# Patient Record
Sex: Male | Born: 1935 | Race: White | Hispanic: No | Marital: Married | Smoking: Never smoker
Health system: Southern US, Community
[De-identification: ages and names within clinical notes are randomized; demographics above are authoritative.]

## PROBLEM LIST (undated history)

## (undated) DIAGNOSIS — I2510 Atherosclerotic heart disease of native coronary artery without angina pectoris: Secondary | ICD-10-CM

## (undated) DIAGNOSIS — I639 Cerebral infarction, unspecified: Secondary | ICD-10-CM

## (undated) DIAGNOSIS — I6529 Occlusion and stenosis of unspecified carotid artery: Secondary | ICD-10-CM

## (undated) DIAGNOSIS — C61 Malignant neoplasm of prostate: Secondary | ICD-10-CM

## (undated) DIAGNOSIS — N400 Enlarged prostate without lower urinary tract symptoms: Secondary | ICD-10-CM

## (undated) DIAGNOSIS — E785 Hyperlipidemia, unspecified: Secondary | ICD-10-CM

## (undated) DIAGNOSIS — I10 Essential (primary) hypertension: Secondary | ICD-10-CM

## (undated) DIAGNOSIS — H3530 Unspecified macular degeneration: Secondary | ICD-10-CM

## (undated) HISTORY — DX: Cerebral infarction, unspecified: I63.9

## (undated) HISTORY — DX: Malignant neoplasm of prostate: C61

## (undated) HISTORY — DX: Atherosclerotic heart disease of native coronary artery without angina pectoris: I25.10

## (undated) HISTORY — DX: Hyperlipidemia, unspecified: E78.5

## (undated) HISTORY — PX: JOINT REPLACEMENT: SHX530

## (undated) HISTORY — DX: Essential (primary) hypertension: I10

---

## 1996-12-24 HISTORY — PX: CORONARY ARTERY BYPASS GRAFT: SHX141

## 1998-02-15 DIAGNOSIS — I639 Cerebral infarction, unspecified: Secondary | ICD-10-CM

## 1998-02-15 HISTORY — DX: Cerebral infarction, unspecified: I63.9

## 1999-05-07 ENCOUNTER — Inpatient Hospital Stay (HOSPITAL_COMMUNITY): Admission: EM | Admit: 1999-05-07 | Discharge: 1999-05-11 | Payer: Self-pay | Admitting: Emergency Medicine

## 1999-05-07 ENCOUNTER — Encounter: Payer: Self-pay | Admitting: Internal Medicine

## 1999-05-11 ENCOUNTER — Encounter: Payer: Self-pay | Admitting: Internal Medicine

## 2001-06-08 ENCOUNTER — Emergency Department (HOSPITAL_COMMUNITY): Admission: EM | Admit: 2001-06-08 | Discharge: 2001-06-08 | Payer: Self-pay | Admitting: *Deleted

## 2001-06-12 ENCOUNTER — Emergency Department (HOSPITAL_COMMUNITY): Admission: EM | Admit: 2001-06-12 | Discharge: 2001-06-12 | Payer: Self-pay | Admitting: Emergency Medicine

## 2006-02-15 HISTORY — PX: TRANSURETHRAL RESECTION OF PROSTATE: SHX73

## 2006-12-07 ENCOUNTER — Ambulatory Visit: Payer: Self-pay | Admitting: Cardiology

## 2006-12-09 ENCOUNTER — Ambulatory Visit: Payer: Self-pay

## 2006-12-21 ENCOUNTER — Encounter (INDEPENDENT_AMBULATORY_CARE_PROVIDER_SITE_OTHER): Payer: Self-pay | Admitting: Urology

## 2006-12-21 ENCOUNTER — Inpatient Hospital Stay (HOSPITAL_COMMUNITY): Admission: RE | Admit: 2006-12-21 | Discharge: 2006-12-26 | Payer: Self-pay | Admitting: Urology

## 2006-12-26 ENCOUNTER — Encounter (INDEPENDENT_AMBULATORY_CARE_PROVIDER_SITE_OTHER): Payer: Self-pay | Admitting: Internal Medicine

## 2007-01-31 ENCOUNTER — Ambulatory Visit: Payer: Self-pay | Admitting: Vascular Surgery

## 2007-02-01 ENCOUNTER — Encounter: Payer: Self-pay | Admitting: Vascular Surgery

## 2007-02-01 ENCOUNTER — Ambulatory Visit: Payer: Self-pay | Admitting: Vascular Surgery

## 2007-02-01 ENCOUNTER — Inpatient Hospital Stay (HOSPITAL_COMMUNITY): Admission: RE | Admit: 2007-02-01 | Discharge: 2007-02-02 | Payer: Self-pay | Admitting: Vascular Surgery

## 2007-02-01 HISTORY — PX: CAROTID ENDARTERECTOMY: SUR193

## 2007-02-21 ENCOUNTER — Ambulatory Visit: Payer: Self-pay | Admitting: Vascular Surgery

## 2007-04-18 ENCOUNTER — Ambulatory Visit (HOSPITAL_COMMUNITY): Admission: RE | Admit: 2007-04-18 | Discharge: 2007-04-18 | Payer: Self-pay | Admitting: Urology

## 2007-12-05 ENCOUNTER — Ambulatory Visit: Payer: Self-pay | Admitting: Vascular Surgery

## 2007-12-14 ENCOUNTER — Ambulatory Visit (HOSPITAL_COMMUNITY): Admission: RE | Admit: 2007-12-14 | Discharge: 2007-12-14 | Payer: Self-pay | Admitting: Urology

## 2008-11-13 ENCOUNTER — Telehealth (INDEPENDENT_AMBULATORY_CARE_PROVIDER_SITE_OTHER): Payer: Self-pay | Admitting: *Deleted

## 2008-11-13 ENCOUNTER — Ambulatory Visit: Payer: Self-pay | Admitting: Cardiovascular Disease

## 2008-11-14 ENCOUNTER — Encounter: Payer: Self-pay | Admitting: Cardiology

## 2008-11-14 ENCOUNTER — Ambulatory Visit: Payer: Self-pay

## 2008-11-20 ENCOUNTER — Ambulatory Visit: Payer: Self-pay | Admitting: Cardiovascular Disease

## 2008-12-03 ENCOUNTER — Ambulatory Visit: Payer: Self-pay | Admitting: Vascular Surgery

## 2008-12-07 ENCOUNTER — Ambulatory Visit: Payer: Self-pay | Admitting: Cardiology

## 2008-12-07 ENCOUNTER — Inpatient Hospital Stay (HOSPITAL_COMMUNITY): Admission: EM | Admit: 2008-12-07 | Discharge: 2008-12-14 | Payer: Self-pay | Admitting: Emergency Medicine

## 2008-12-09 ENCOUNTER — Encounter (INDEPENDENT_AMBULATORY_CARE_PROVIDER_SITE_OTHER): Payer: Self-pay | Admitting: Internal Medicine

## 2008-12-16 ENCOUNTER — Telehealth: Payer: Self-pay | Admitting: Cardiovascular Disease

## 2008-12-17 ENCOUNTER — Ambulatory Visit: Payer: Self-pay | Admitting: Cardiovascular Disease

## 2008-12-18 ENCOUNTER — Telehealth: Payer: Self-pay | Admitting: Cardiovascular Disease

## 2008-12-20 ENCOUNTER — Telehealth: Payer: Self-pay | Admitting: Cardiovascular Disease

## 2008-12-24 ENCOUNTER — Telehealth: Payer: Self-pay | Admitting: Cardiovascular Disease

## 2009-01-14 ENCOUNTER — Telehealth: Payer: Self-pay | Admitting: Cardiovascular Disease

## 2009-08-21 ENCOUNTER — Telehealth: Payer: Self-pay | Admitting: Cardiovascular Disease

## 2009-08-22 ENCOUNTER — Ambulatory Visit: Payer: Self-pay | Admitting: Cardiovascular Disease

## 2010-02-19 ENCOUNTER — Ambulatory Visit
Admission: RE | Admit: 2010-02-19 | Discharge: 2010-02-19 | Payer: Self-pay | Source: Home / Self Care | Attending: Vascular Surgery | Admitting: Vascular Surgery

## 2010-02-24 ENCOUNTER — Ambulatory Visit
Admission: RE | Admit: 2010-02-24 | Discharge: 2010-02-24 | Payer: Self-pay | Source: Home / Self Care | Attending: Vascular Surgery | Admitting: Vascular Surgery

## 2010-03-02 ENCOUNTER — Ambulatory Visit
Admission: RE | Admit: 2010-03-02 | Discharge: 2010-03-02 | Payer: Self-pay | Source: Home / Self Care | Attending: Cardiovascular Disease | Admitting: Cardiovascular Disease

## 2010-03-06 ENCOUNTER — Telehealth (INDEPENDENT_AMBULATORY_CARE_PROVIDER_SITE_OTHER): Payer: Self-pay | Admitting: *Deleted

## 2010-03-11 ENCOUNTER — Telehealth: Payer: Self-pay | Admitting: Cardiovascular Disease

## 2010-03-13 LAB — COMPREHENSIVE METABOLIC PANEL
ALT: 18 U/L (ref 0–53)
Alkaline Phosphatase: 114 U/L (ref 39–117)
BUN: 29 mg/dL — ABNORMAL HIGH (ref 6–23)
Chloride: 99 mEq/L (ref 96–112)
Glucose, Bld: 244 mg/dL — ABNORMAL HIGH (ref 70–99)

## 2010-03-13 LAB — PROTIME-INR
INR: 0.97 (ref 0.00–1.49)
Prothrombin Time: 13.1 seconds (ref 11.6–15.2)

## 2010-03-13 LAB — CBC
Hemoglobin: 16.5 g/dL (ref 13.0–17.0)
MCH: 29.2 pg (ref 26.0–34.0)
MCHC: 34 g/dL (ref 30.0–36.0)
MCV: 85.7 fL (ref 78.0–100.0)
Platelets: 170 10*3/uL (ref 150–400)
RBC: 5.66 MIL/uL (ref 4.22–5.81)
RDW: 13.6 % (ref 11.5–15.5)

## 2010-03-13 LAB — URINALYSIS, ROUTINE W REFLEX MICROSCOPIC
Bilirubin Urine: NEGATIVE
Hgb urine dipstick: NEGATIVE
Ketones, ur: NEGATIVE mg/dL
Specific Gravity, Urine: 1.019 (ref 1.005–1.030)
Urine Glucose, Fasting: 500 mg/dL — AB
pH: 6 (ref 5.0–8.0)

## 2010-03-13 LAB — URINE MICROSCOPIC-ADD ON

## 2010-03-13 LAB — SURGICAL PCR SCREEN: MRSA, PCR: NEGATIVE

## 2010-03-17 NOTE — Progress Notes (Signed)
  Phone Note Outgoing Call   Call placed by: Dessie Coma,  August 21, 2009 10:27 AM Call placed to: Patient Summary of Call: Patient notified via wife, needs f/u appt. with Dr. Kirke Corin since using NTG frequently.  Wife advised to call Dtr. at [phone] to schedule appt. since she will be bringing patient.  Appt. scheduled with Dtr. Noreene Larsson for tomorrow at 10:30am.

## 2010-03-18 ENCOUNTER — Other Ambulatory Visit: Payer: Self-pay | Admitting: Vascular Surgery

## 2010-03-18 ENCOUNTER — Inpatient Hospital Stay (HOSPITAL_COMMUNITY)
Admission: RE | Admit: 2010-03-18 | Discharge: 2010-03-19 | DRG: 039 | Disposition: A | Discharge status: Home / Self Care | Payer: MEDICARE | Attending: Vascular Surgery | Admitting: Vascular Surgery

## 2010-03-18 DIAGNOSIS — I6529 Occlusion and stenosis of unspecified carotid artery: Secondary | ICD-10-CM

## 2010-03-18 DIAGNOSIS — I10 Essential (primary) hypertension: Secondary | ICD-10-CM | POA: Diagnosis present

## 2010-03-18 DIAGNOSIS — E785 Hyperlipidemia, unspecified: Secondary | ICD-10-CM | POA: Diagnosis present

## 2010-03-18 DIAGNOSIS — E119 Type 2 diabetes mellitus without complications: Secondary | ICD-10-CM | POA: Diagnosis present

## 2010-03-18 DIAGNOSIS — Z7982 Long term (current) use of aspirin: Secondary | ICD-10-CM

## 2010-03-18 DIAGNOSIS — Z8673 Personal history of transient ischemic attack (TIA), and cerebral infarction without residual deficits: Secondary | ICD-10-CM

## 2010-03-18 DIAGNOSIS — Z7902 Long term (current) use of antithrombotics/antiplatelets: Secondary | ICD-10-CM

## 2010-03-18 DIAGNOSIS — Z8546 Personal history of malignant neoplasm of prostate: Secondary | ICD-10-CM

## 2010-03-18 DIAGNOSIS — Z96649 Presence of unspecified artificial hip joint: Secondary | ICD-10-CM

## 2010-03-18 DIAGNOSIS — I2510 Atherosclerotic heart disease of native coronary artery without angina pectoris: Secondary | ICD-10-CM | POA: Diagnosis present

## 2010-03-18 HISTORY — PX: CAROTID ENDARTERECTOMY: SUR193

## 2010-03-18 LAB — GLUCOSE, CAPILLARY
Glucose-Capillary: 265 mg/dL — ABNORMAL HIGH (ref 70–99)
Glucose-Capillary: 291 mg/dL — ABNORMAL HIGH (ref 70–99)

## 2010-03-19 LAB — BASIC METABOLIC PANEL
BUN: 19 mg/dL (ref 6–23)
Calcium: 8.7 mg/dL (ref 8.4–10.5)
Creatinine, Ser: 1.31 mg/dL (ref 0.4–1.5)
Potassium: 3.9 mEq/L (ref 3.5–5.1)

## 2010-03-19 LAB — CBC
Hemoglobin: 13.8 g/dL (ref 13.0–17.0)
MCV: 85.6 fL (ref 78.0–100.0)
Platelets: 152 10*3/uL (ref 150–400)

## 2010-03-19 LAB — GLUCOSE, CAPILLARY
Glucose-Capillary: 225 mg/dL — ABNORMAL HIGH (ref 70–99)
Glucose-Capillary: 319 mg/dL — ABNORMAL HIGH (ref 70–99)

## 2010-03-19 NOTE — Progress Notes (Signed)
Summary: TEST RESULTS  Phone Note Call from Patient Call back at Home Phone [phone]   Caller: Spouse Summary of Call: PT WOULD LIKE RESULTS OF TEST DONE MONDAY AT [facility].  HE IS VERY ANXIOUS TO HEAR. Initial call taken by: Park Breed,  March 11, 2010 8:51 AM  Follow-up for Phone Call        [facility] for the results and they are to fax them to me. Follow-up by: Dessie Coma  LPN,  March 12, 2010 10:52 AM  Additional Follow-up for Phone Call Additional follow up Details #1::        Dr. Kirke Corin spoke with patient's wife re: test. Additional Follow-up by: Dessie Coma  LPN,  March 13, 2010 10:39 AM

## 2010-03-19 NOTE — Progress Notes (Signed)
----   Converted from flag ---- ---- 03/06/2010 10:44 AM, Marilynne Halsted, CMA, AAMA wrote: APPROVED NUC TEST  ------------------------------

## 2010-03-20 LAB — CROSSMATCH: Unit division: 0

## 2010-03-20 NOTE — Op Note (Signed)
NAMEMELBOURNE, JAKUBIAK             ACCOUNT NO.:  [id number]  MEDICAL RECORD NO.:  [id number]           PATIENT TYPE:  I  LOCATION:  3310                         FACILITY:  [facility]  PHYSICIAN:  Quita Skye. Hart Rochester, M.D.  DATE OF BIRTH:  June 04, 1928  DATE OF PROCEDURE:  03/18/2010 DATE OF DISCHARGE:                              OPERATIVE REPORT   PREOPERATIVE DIAGNOSIS:  Severe asymptomatic right internal carotid stenosis.  POSTOPERATIVE DIAGNOSIS:  Severe asymptomatic right internal carotid stenosis.  OPERATION:  Right carotid endarterectomy with Dacron patch angioplasty.  SURGEON:  Quita Skye. Hart Rochester, MD  FIRST ASSISTANT:  Della Goo, PA-C  ANESTHESIA:  General endotracheal.  BRIEF HISTORY:  This patient has previously had a left carotid surgery and had a moderate right internal carotid stenosis, which was being followed.  This progressed to a 90% stenosis and the patient remained asymptomatic, scheduled for right carotid endarterectomy on an elective basis.  PROCEDURE:  The patient was taken to the operating room, placed in a supine position at which time satisfactory general endotracheal anesthesia was administered.  The right neck was prepped with Betadine scrub and solution and draped in routine sterile manner.  Incision was made along the anterior border of the sternocleidomastoid muscle and carried down through subcutaneous tissue and platysma using Bovie. Common facial vein and external jugular veins were ligated with 3-0 silk ties and divided exposing the common internal and external carotid artery.  Care was taken not to injure the vagus or hypoglossal nerves, both which were exposed.  There was a heavily calcified plaque at the carotid bifurcation extending posteriorly up the internal carotid artery about 4 cm.  Distal vessel appeared normal.  A #10 shunt was prepared and the patient was heparinized.  Carotid vessels were occluded with vascular clamps.  Longitudinal  opening made in the common carotid with 15 blade, extended up the internal carotid with Potts scissors to a point distal to the disease.  The plaque was at least 90% stenotic in severity.  Distal vessel appeared normal.  A #10 shunt was inserted without difficulty reestablishing flow in about 2 minutes.  Standard endarterectomy was then performed using the elevator and Potts scissors with eversion endarterectomy of the external carotid.  Plaque feathered off the distal internal carotid artery nicely not requiring any tacking sutures.  Wound was thoroughly irrigated with heparin saline.  All loose debris carefully removed.  Arteriotomy was closed with a patch using continuous 6-0 Prolene.  Prior to completion of closure, shunt was removed after about 30 minutes of shunt time.  Following antegrade and retrograde flushing, closure was completed reestablishing flow initially up the external and internal branch.  There was excellent flow in the distal internal carotid artery with a Doppler, but the external carotid flow was somewhat muffled.  Therefore, external was occluded proximally and distally and a transverse arteriotomy made about 3 cm distal to the origin, and there was some intimal disruption where the eversion endarterectomy had been performed.  This was removed under direct vision with excellent back bleeding and antegrade bleeding and the short arteriotomy re-closed with 6-0 Prolene.  Clamp was then released and  there was excellent Doppler flow in both vessels.  Protamine given to reverse the heparin.  Following adequate hemostasis, wound was irrigated with saline, closed in layers with Vicryl in a subcuticular fashion. Sterile dressing applied.  The patient was taken to the recovery room in a satisfactory condition.     Quita Skye Hart Rochester, M.D.     JDL/MEDQ  D:  03/18/2010  T:  03/19/2010  Job:  [id number]  Electronically Signed by Josephina Gip M.D. on 03/20/2010 12:49:05 PM

## 2010-03-20 NOTE — Discharge Summary (Addendum)
  NAMEPABLO, [name]             ACCOUNT NO.:  [id number]  MEDICAL RECORD NO.:  [id number]           PATIENT TYPE:  I  LOCATION:  3310                         FACILITY:  [facility]  PHYSICIAN:  Quita Skye. [name], M.D.  DATE OF BIRTH:  05-Nov-1928  DATE OF ADMISSION:  03/18/2010 DATE OF DISCHARGE:  03/19/2010                              DISCHARGE SUMMARY   CHIEF COMPLAINT:  Right carotid occlusive disease.  HISTORY OF PRESENT ILLNESS:  [name] is a 77 year old gentleman who had previously undergone a left carotid endarterectomy in December 2008, by Dr. 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  He had multiple neurological problems in the past and he has been followed for moderate right internal carotid artery stenosis which over the past 14 months has progressed from 50-95%.  He has no active right brain symptoms.  He is now being evaluated for severe right internal carotid artery stenosis. Duplex scan revealed 95% right carotid stenosis and no flow reduction in the left internal carotid artery.  The patient was admitted for right carotid endarterectomy.  PAST MEDICAL HISTORY: 1. Diabetes. 2. Hypertension. 3. Hyperlipidemia. 4. Coronary artery disease. 5. Prostate cancer. 6. History of hip replacement.  HOSPITAL COURSE:  The patient was taken to the operating room on March 18, 2010, for a right carotid endarterectomy with Dacron patch angioplasty.  Postoperatively, the patient did well.  He was alert and oriented x3.  He had good and equal strength in bilateral upper and lower extremities.  He had some dizziness initially on the first postoperative day with ambulation but then was able to ambulate without difficulty.  He was voiding and taking p.o.  He had good and equal strength in bilateral upper and lower extremities.  He had no tongue deviation.  No facial droop.  No difficulty swallowing and no headache. He will be discharged home.  DISCHARGE MEDICATIONS: 1.  Percocet 1-2 tablets every 4 hours as needed for pain. 2. Amlodipine 10 mg at bedtime. 3. Alka-Seltzer over-the-counter antacid 2 tablets daily at bedtime as     needed. 4. Enteric-coated aspirin 81 mg daily. 5. Plavix 75 mg daily. 6. Furosemide 20 mg daily. 7. Glipizide 10 mg twice daily. 8. Isosorbide 60 mg twice daily. 9. Lisinopril 20 mg twice daily. 10.Lorazepam 1 tablet at bedtime as needed for anxiety. 11.Sublingual nitroglycerin as needed for chest pain.  FINAL DIAGNOSES: 1. Critical asymptomatic right carotid stenosis, greater than 95%,     status post right carotid endarterectomy. 2. Hypertension. 3. Diabetes. 4. Hyperlipidemia, well controlled with his preoperative medications     while in-house.  DISPOSITION:  The patient will be discharged home and follow up with Dr. Hart Haynes in 2 weeks.     Della Goo, PA-C   ______________________________ Quita Skye [name], M.D.    RR/MEDQ  D:  03/19/2010  T:  03/20/2010  Job:  [id number]  Electronically Signed by Josephina Gip M.D. on 03/20/2010 12:49:09 PM Electronically Signed by Della Goo PA on 03/23/2010 10:18:25 AM

## 2010-03-31 ENCOUNTER — Ambulatory Visit (INDEPENDENT_AMBULATORY_CARE_PROVIDER_SITE_OTHER): Payer: MEDICARE | Admitting: Vascular Surgery

## 2010-03-31 DIAGNOSIS — I6529 Occlusion and stenosis of unspecified carotid artery: Secondary | ICD-10-CM

## 2010-04-01 NOTE — Assessment & Plan Note (Signed)
OFFICE VISIT  [name], [name] DOB:  1928-05-29                                       03/31/2010 EAVWU#:[id number]  The patient returns for followup 2 weeks post-right carotid endarterectomy for severe but asymptomatic right internal carotid stenosis.  He has done very well since his surgery with no hoarseness, difficulty swallowing, or hemispheric or non-hemispheric TIAs.  He is taking aspirin and Plavix on a daily basis.  On physical exam, blood pressure is 140/83, heart rate 86, respirations 14, temperature of 98.  General:  He is a well-developed, well-nourished male who is in no apparent distress, alert and oriented x3.  The neck is supple, 3+ carotid pulses.  No bruits are audible.  Right neck incision has healed nicely.  Neurologic:  Normal.  We are very pleased with his early result and encouraged him to continue to increase his activity as tolerated.  He will return in 6 months for followup carotid duplex exam at that time.  If he develops any neurologic symptoms in the interim, he will be in touch with [place] for further evaluation.    Quita Skye Hart Rochester, M.D. Electronically Signed  JDL/MEDQ  D:  03/31/2010  T:  04/01/2010  Job:  [id number]

## 2010-05-19 ENCOUNTER — Telehealth: Payer: Self-pay | Admitting: Cardiovascular Disease

## 2010-05-19 DIAGNOSIS — R609 Edema, unspecified: Secondary | ICD-10-CM

## 2010-05-19 MED ORDER — FUROSEMIDE 20 MG PO TABS: 20.0000 mg | ORAL_TABLET | Freq: Every day | ORAL | Status: DC

## 2010-05-19 NOTE — Telephone Encounter (Signed)
Refilled prescription for Lasix 20mg  at [facility] mail order [facility].

## 2010-05-21 LAB — BASIC METABOLIC PANEL
BUN: 21 mg/dL (ref 6–23)
BUN: 29 mg/dL — ABNORMAL HIGH (ref 6–23)
CO2: 24 mEq/L (ref 19–32)
CO2: 24 mEq/L (ref 19–32)
CO2: 26 mEq/L (ref 19–32)
CO2: 27 mEq/L (ref 19–32)
CO2: 27 mEq/L (ref 19–32)
CO2: 30 mEq/L (ref 19–32)
Calcium: 8.4 mg/dL (ref 8.4–10.5)
Calcium: 8.5 mg/dL (ref 8.4–10.5)
Calcium: 8.6 mg/dL (ref 8.4–10.5)
Calcium: 8.7 mg/dL (ref 8.4–10.5)
Calcium: 8.7 mg/dL (ref 8.4–10.5)
Calcium: 8.8 mg/dL (ref 8.4–10.5)
Calcium: 9 mg/dL (ref 8.4–10.5)
Chloride: 103 mEq/L (ref 96–112)
Chloride: 104 mEq/L (ref 96–112)
Chloride: 106 mEq/L (ref 96–112)
Chloride: 98 mEq/L (ref 96–112)
Creatinine, Ser: 1.08 mg/dL (ref 0.4–1.5)
Creatinine, Ser: 1.28 mg/dL (ref 0.4–1.5)
Creatinine, Ser: 1.3 mg/dL (ref 0.4–1.5)
Creatinine, Ser: 1.36 mg/dL (ref 0.4–1.5)
Creatinine, Ser: 1.43 mg/dL (ref 0.4–1.5)
GFR calc Af Amer: 58 mL/min — ABNORMAL LOW (ref >60)
GFR calc Af Amer: >60 mL/min (ref >60)
GFR calc Af Amer: >60 mL/min (ref >60)
GFR calc Af Amer: >60 mL/min (ref >60)
GFR calc Af Amer: >60 mL/min (ref >60)
GFR calc non Af Amer: 48 mL/min — ABNORMAL LOW (ref >60)
GFR calc non Af Amer: 54 mL/min — ABNORMAL LOW (ref >60)
Glucose, Bld: 119 mg/dL — ABNORMAL HIGH (ref 70–99)
Glucose, Bld: 159 mg/dL — ABNORMAL HIGH (ref 70–99)
Glucose, Bld: 196 mg/dL — ABNORMAL HIGH (ref 70–99)
Potassium: 4.5 mEq/L (ref 3.5–5.1)
Sodium: 136 mEq/L (ref 135–145)
Sodium: 137 mEq/L (ref 135–145)
Sodium: 140 mEq/L (ref 135–145)
Sodium: 142 mEq/L (ref 135–145)

## 2010-05-21 LAB — URINALYSIS, MICROSCOPIC ONLY
Leukocytes, UA: NEGATIVE
Nitrite: NEGATIVE
Specific Gravity, Urine: 1.02 (ref 1.005–1.030)
pH: 5 (ref 5.0–8.0)

## 2010-05-21 LAB — CARDIAC PANEL(CRET KIN+CKTOT+MB+TROPI)
CK, MB: 11.8 ng/mL — ABNORMAL HIGH (ref 0.3–4.0)
CK, MB: 18.9 ng/mL — ABNORMAL HIGH (ref 0.3–4.0)
Relative Index: INVALID (ref 0.0–2.5)
Troponin I: 0.48 ng/mL — ABNORMAL HIGH (ref 0.00–0.06)

## 2010-05-21 LAB — GLUCOSE, CAPILLARY
Glucose-Capillary: 152 mg/dL — ABNORMAL HIGH (ref 70–99)
Glucose-Capillary: 172 mg/dL — ABNORMAL HIGH (ref 70–99)
Glucose-Capillary: 172 mg/dL — ABNORMAL HIGH (ref 70–99)
Glucose-Capillary: 187 mg/dL — ABNORMAL HIGH (ref 70–99)
Glucose-Capillary: 205 mg/dL — ABNORMAL HIGH (ref 70–99)
Glucose-Capillary: 207 mg/dL — ABNORMAL HIGH (ref 70–99)
Glucose-Capillary: 216 mg/dL — ABNORMAL HIGH (ref 70–99)
Glucose-Capillary: 217 mg/dL — ABNORMAL HIGH (ref 70–99)
Glucose-Capillary: 229 mg/dL — ABNORMAL HIGH (ref 70–99)
Glucose-Capillary: 234 mg/dL — ABNORMAL HIGH (ref 70–99)
Glucose-Capillary: 236 mg/dL — ABNORMAL HIGH (ref 70–99)

## 2010-05-21 LAB — LIPID PANEL
Cholesterol: 263 mg/dL — ABNORMAL HIGH (ref 0–200)
Total CHOL/HDL Ratio: 6.4 RATIO
VLDL: 22 mg/dL (ref 0–40)

## 2010-05-21 LAB — MAGNESIUM
Magnesium: 1.8 mg/dL (ref 1.5–2.5)
Magnesium: 1.9 mg/dL (ref 1.5–2.5)
Magnesium: 1.9 mg/dL (ref 1.5–2.5)
Magnesium: 2 mg/dL (ref 1.5–2.5)
Magnesium: 2.1 mg/dL (ref 1.5–2.5)
Magnesium: 2.1 mg/dL (ref 1.5–2.5)

## 2010-05-21 LAB — HEPARIN LEVEL (UNFRACTIONATED)
Heparin Unfractionated: 0.45 IU/mL (ref 0.30–0.70)
Heparin Unfractionated: 0.55 IU/mL (ref 0.30–0.70)
Heparin Unfractionated: 0.7 IU/mL (ref 0.30–0.70)

## 2010-05-21 LAB — CBC
HCT: 34.8 % — ABNORMAL LOW (ref 39.0–52.0)
HCT: 40 % (ref 39.0–52.0)
HCT: 44.5 % (ref 39.0–52.0)
Hemoglobin: 11.5 g/dL — ABNORMAL LOW (ref 13.0–17.0)
Hemoglobin: 12.3 g/dL — ABNORMAL LOW (ref 13.0–17.0)
Hemoglobin: 13.2 g/dL (ref 13.0–17.0)
Hemoglobin: 13.4 g/dL (ref 13.0–17.0)
Hemoglobin: 13.9 g/dL (ref 13.0–17.0)
Hemoglobin: 15.5 g/dL (ref 13.0–17.0)
MCHC: 34.6 g/dL (ref 30.0–36.0)
MCHC: 34.7 g/dL (ref 30.0–36.0)
MCHC: 35.1 g/dL (ref 30.0–36.0)
MCHC: 35.2 g/dL (ref 30.0–36.0)
MCHC: 35.2 g/dL (ref 30.0–36.0)
MCHC: 35.3 g/dL (ref 30.0–36.0)
MCHC: 35.9 g/dL (ref 30.0–36.0)
MCV: 87.9 fL (ref 78.0–100.0)
MCV: 87.9 fL (ref 78.0–100.0)
MCV: 88 fL (ref 78.0–100.0)
MCV: 88.4 fL (ref 78.0–100.0)
Platelets: 85 10*3/uL — ABNORMAL LOW (ref 150–400)
RBC: 3.5 MIL/uL — ABNORMAL LOW (ref 4.22–5.81)
RBC: 3.52 MIL/uL — ABNORMAL LOW (ref 4.22–5.81)
RBC: 3.71 MIL/uL — ABNORMAL LOW (ref 4.22–5.81)
RBC: 3.95 MIL/uL — ABNORMAL LOW (ref 4.22–5.81)
RBC: 4.31 MIL/uL (ref 4.22–5.81)
RBC: 4.4 MIL/uL (ref 4.22–5.81)
RBC: 4.55 MIL/uL (ref 4.22–5.81)
RBC: 5.04 MIL/uL (ref 4.22–5.81)
RDW: 13.8 % (ref 11.5–15.5)
RDW: 13.9 % (ref 11.5–15.5)
RDW: 13.9 % (ref 11.5–15.5)
WBC: 4.4 10*3/uL (ref 4.0–10.5)
WBC: 4.5 10*3/uL (ref 4.0–10.5)
WBC: 4.6 10*3/uL (ref 4.0–10.5)
WBC: 5.3 10*3/uL (ref 4.0–10.5)
WBC: 5.7 10*3/uL (ref 4.0–10.5)
WBC: 6.4 10*3/uL (ref 4.0–10.5)
WBC: 7 10*3/uL (ref 4.0–10.5)

## 2010-05-21 LAB — DIFFERENTIAL
Eosinophils Relative: 1 % (ref 0–5)
Monocytes Absolute: 0.4 10*3/uL (ref 0.1–1.0)
Neutro Abs: 5 10*3/uL (ref 1.7–7.7)
Neutrophils Relative %: 79 % — ABNORMAL HIGH (ref 43–77)

## 2010-05-21 LAB — PHOSPHORUS
Phosphorus: 3 mg/dL (ref 2.3–4.6)
Phosphorus: 3.1 mg/dL (ref 2.3–4.6)
Phosphorus: 3.2 mg/dL (ref 2.3–4.6)
Phosphorus: 3.3 mg/dL (ref 2.3–4.6)
Phosphorus: 3.5 mg/dL (ref 2.3–4.6)

## 2010-05-21 LAB — PROTIME-INR
INR: 1.09 (ref 0.00–1.49)
Prothrombin Time: 14 seconds (ref 11.6–15.2)

## 2010-05-21 LAB — HEPATIC FUNCTION PANEL
ALT: 16 U/L (ref 0–53)
Albumin: 3.3 g/dL — ABNORMAL LOW (ref 3.5–5.2)
Alkaline Phosphatase: 59 U/L (ref 39–117)
Total Bilirubin: 0.6 mg/dL (ref 0.3–1.2)

## 2010-05-21 LAB — URINALYSIS, ROUTINE W REFLEX MICROSCOPIC
Ketones, ur: 15 mg/dL — AB
Leukocytes, UA: NEGATIVE
Nitrite: NEGATIVE
Protein, ur: 30 mg/dL — AB

## 2010-05-21 LAB — COMPREHENSIVE METABOLIC PANEL
ALT: 21 U/L (ref 0–53)
Albumin: 4.3 g/dL (ref 3.5–5.2)
Alkaline Phosphatase: 88 U/L (ref 39–117)
Calcium: 9.9 mg/dL (ref 8.4–10.5)
Chloride: 101 mEq/L (ref 96–112)
Creatinine, Ser: 1.27 mg/dL (ref 0.4–1.5)
GFR calc Af Amer: >60 mL/min (ref >60)
Potassium: 3.8 mEq/L (ref 3.5–5.1)
Sodium: 139 mEq/L (ref 135–145)

## 2010-05-21 LAB — HEMOGLOBIN A1C: Hgb A1c MFr Bld: 8.6 % — ABNORMAL HIGH (ref 4.6–6.1)

## 2010-05-21 LAB — POCT CARDIAC MARKERS
Myoglobin, poc: 174 ng/mL (ref 12–200)
Troponin i, poc: <0.05 ng/mL (ref 0.00–0.09)

## 2010-05-21 LAB — URINE CULTURE

## 2010-06-18 ENCOUNTER — Ambulatory Visit: Payer: MEDICARE | Admitting: Cardiovascular Disease

## 2010-06-30 NOTE — Procedures (Signed)
CAROTID DUPLEX EXAM   INDICATION:  Follow up known carotid artery disease.   HISTORY:  Diabetes:  Yes.  Cardiac:  Yes.  Hypertension:  Yes.  Smoking:  No.  Previous Surgery:  Left carotid endarterectomy.  CV History:  Amaurosis Fugax No, Paresthesias No, Hemiparesis No.                                       RIGHT             LEFT  Brachial systolic pressure:         178               160  Brachial Doppler waveforms:         Biphasic          Biphasic  Vertebral direction of flow:        Not well visualized                 Antegrade  DUPLEX VELOCITIES (cm/sec)  CCA peak systolic                   86                146  ECA peak systolic                   310               147  ICA peak systolic                   142               35  ICA end diastolic                   43                33  PLAQUE MORPHOLOGY:                  Heterogenous      None  PLAQUE AMOUNT:                      Moderate          None  PLAQUE LOCATION:                    ICA, ECA          None   IMPRESSION:  1. 40-59% stenosis noted in the right internal carotid artery.  2. Normal carotid duplex noted in the left internal carotid artery,      status post left carotid endarterectomy.  3. Antegrade left vertebral artery.   ___________________________________________  Quita Skye Hart Rochester, M.D.   MG/MEDQ  D:  12/05/2007  T:  12/05/2007  Job:  [id number]

## 2010-06-30 NOTE — Assessment & Plan Note (Signed)
OFFICE VISIT   [name], [name]  DOB:  February 14, 1929                                       02/24/2010  ZOXWR#:[id number]   REASON FOR ADMISSION:  Severe right internal carotid stenosis, 75%,  asymptomatic.   HISTORY OF PRESENT ILLNESS:  This 77 year old male patient has  previously undergone a left carotid endarterectomy by Dr. Hart Rochester in  December 2008 for a severe left internal carotid stenosis, having  previously had a left brain CVA.  He has had multiple neurologic  problems in the past.  He has been followed for a moderate right  internal carotid stenosis, and over the past 14 months this has  progressed from 50% to 95+% on the right side.  He has no active right  brain symptoms.  He is now being evaluated for severe right internal  carotid stenosis.  Today I ordered a carotid duplex exam which I  reviewed and interpreted.  He has a 95+% right internal carotid  stenosis.  No flow reduction in the left internal carotid where he had  previous surgery.   CHRONIC MEDICAL PROBLEMS:  1. Diabetes mellitus.  2. Hypertension.  3. Hyperlipidemia.  4. Coronary artery disease.  He had a myocardial infarction in August      2010.  He had coronary artery bypass grafting in 1998 by Dr.      Tyrone Sage.  5. Prostate cancer.  6. History of hip replacement.   SOCIAL HISTORY:  The patient is married and has 3 children.  He is  retired and does not use tobacco or alcohol.   FAMILY HISTORY:  Father died of coronary artery disease, age 58.  He has  sisters and brothers with diabetes.  No stroke in the family.   REVIEW OF SYSTEMS:  Positive for decreased vision, aphasia, chest  discomfort on occasion, dyspnea on exertion, depression, anxiety,  diarrhea, constipation.  All other systems are negative in the complete  review of systems.   PHYSICAL EXAMINATION:  Blood pressure 144/72, heart rate 80,  respirations 16.  General:  He is an elderly male who is in no  apparent  distress, alert and oriented x3.  HEENT:  Normal for age.  EOMs intact.  Lungs:  Clear to auscultation.  No rhonchi or wheezing.  Cardiovascular:  Regular rate and rhythm.  No murmurs.  Carotid pulses are 3+.  There is  short, high-pitched bruit on the right.  No bruit on the left.  Abdomen:  Soft, nontender with no masses.  Neurologic:  No focal weakness.  Musculoskeletal exam is free of major deformities.  Skin:  Free of  rashes.  Lower extremity exam reveals bilateral 3+ femoral pulses.   IMPRESSION:  1. Severe right internal carotid stenosis, asymptomatic.  2. Coronary artery disease, previous cardiac catheterization in August      2010, previous myocardial infarction at that time.  Coronary artery      bypass grafting in 1998.   PLAN:  Obtain a Cardiolite and a preoperative clearance from cardiology  by [facility] in [place].  Will tentatively schedule right carotid surgery  for Wednesday, January 25, at [facility].  Risks and benefits have  been thoroughly discussed with the patient, his wife, and daughter, and  they all agree to proceed.     Quita Skye Hart Rochester, M.D.  Electronically Signed   JDL/MEDQ  D:  02/24/2010  T:  02/24/2010  Job:  [id number]

## 2010-06-30 NOTE — Assessment & Plan Note (Signed)
[facility]                        [place] [facility] OFFICE NOTE   [name], VINJE                    MRN:          [id number]  DATE:03/02/2010                            DOB:          1929-02-04    [name] is a 77 year old gentleman who is here today for a  preoperative cardiovascular evaluation regarding a planned severe right  carotid endarterectomy to be done by Dr. Josephina Gip with [facility] [place].  The patient has the following extensive  cardiac history:  1. Coronary artery disease status post coronary artery bypass graft      surgery in 1998.  He had a non-ST-elevation myocardial infarction      in October 2010.  Cardiac catheterization at that time showed an      occluded vein graft to the RCA as well as the diagonal.  The LIMA      to LAD was patent as well as an SVG to small diagonal.  There was      another SVG to second marginal which was subtotally occluded with a      large burden of thrombus.  He was treated medically at that time.      Ejection fraction was 45%.  2. Ischemic cardiomyopathy with mildly reduced LV systolic function.      Ejection fraction was 45%.  3. Peripheral vascular disease status post left carotid      endarterectomy.  4. Hypertension.  5. Hyperlipidemia.   INTERVAL HISTORY:  The patient was recently found to have severe right  carotid stenosis by routine carotid Doppler which showed more than 75%  stenosis on the right side.  The plan is for carotid endarterectomy  after evaluating his cardiac status.  The patient has extensive cardiac  history as outlined above.   He continues to have occasional chest pain and on average he uses  nitroglycerin sublingual every few days, usually 25 tablet is enough for  a month.  According to him, his chest pain is as not as much as it was  in 2010.  He has chronic exertional dyspnea.  He does not do any regular  exercise.  He  stopped taking metoprolol that was prescribed to him  during his last visit.   MEDICATIONS:  1. Glipizide 10 mg twice daily.  2. Aspirin 81 mg once daily.  3. Plavix 75 mg once daily.  4. Isosorbide mononitrate 60 mg twice daily.  5. Lisinopril 20 mg twice daily.  6. Amlodipine 10 mg once daily.  7. Lasix 20 mg once daily.  8. Nitroglycerin 0.4 mg sublingual as needed.   PHYSICAL EXAMINATION:  GENERAL:  The patient appears to be at his stated  age and in no acute distress.  VITAL SIGNS:  Weight is 204.2 pounds, blood pressure is 159/83, pulse is  81, oxygen saturation is 97% on room air.  HEENT:  Normocephalic, atraumatic.  NECK:  There is a scar on the left side of the neck from his previous  carotid endarterectomy.  On the right side, there is a loud bruit heard.  RESPIRATORY:  Normal respiratory  effort with no use of accessory  muscles.  Auscultation reveals decreased breath sounds at the base  bilaterally.  CARDIOVASCULAR:  Normal PMI.  Normal S1 and S2 with no gallops or  murmurs.  ABDOMEN:  Benign, nontender, nondistended.  EXTREMITIES:  No clubbing, cyanosis, or edema.  SKIN:  Warm and dry with no rash.  PSYCHIATRIC:  He is alert, oriented x3 with normal mood and affect.   An electrocardiogram was performed which showed sinus rhythm with first-  degree AV block with left axis deviation.  There is ST and T-wave  abnormality in the anterior leads as well as the lateral leads which are  not different from his previous EKG.   IMPRESSION:  1. Preoperative cardiovascular evaluation for a planned right carotid      endarterectomy.  The patient has extensive cardiac history.  His      most recent cardiac event was in 2010 when he had myocardial      infarction.  At that time, cardiac catheterization showed that 3/5      grafts were actually nonfunctional.  His current functioning grafts      include LIMA to LAD and SVG to diagonal.  He continues to have      chronic  exertional angina, which is relieved by rest and sometimes      requires nitroglycerin.  He did not tolerate Ranexa in the past due      to GI symptoms.  I agree that a stress test is needed to evaluate      his cardiac risk.  Thus, we will request a Lexiscan nuclear stress      test for further evaluation.  If there are major areas of ischemia      with high-risk features, his risk might be high to the point were a      surgery is not possible.  However, if the stress test only shows      mild ischemia, then he will be able to proceed with the surgery at      moderate risk.  It he is deemed to be a high risk patient, then      consideration should be to treat him medically verses evaluating      him for carotid stenting.  In the meanwhile, I will go ahead and      start him on metoprolol 25 mg twice a day as he was on this      medication before.  I think this will give him some cardiac      protection during the surgery.  This was discussed extensively with      the patient and his family.  The patient is actually leaning      towards having the surgery done even if it is at the higher risk      because he is very worried about having a stroke in the future.  2. Hypertension:  Blood pressure is mildly elevated today.  We will      resume metoprolol.  3. Hyperlipidemia:  He is intolerant to statins due to myalgia.  The      patient will be notified with the results of the stress test.      Otherwise, he will return to see me in 4 months from now.     Lorine Bears, MD  Electronically Signed    MA/MedQ  DD: 03/02/2010  DT: 03/03/2010  Job #: [id number]

## 2010-06-30 NOTE — Op Note (Signed)
[name], [name]             ACCOUNT NO.:  [id number]   MEDICAL RECORD NO.:  [id number]          PATIENT TYPE:  INP   LOCATION:  NA                           FACILITY:  [facility]   PHYSICIAN:  Maretta Bees. Vonita Moss, M.D.DATE OF BIRTH:  [birth year]   DATE OF PROCEDURE:  12/21/2006  DATE OF DISCHARGE:                               OPERATIVE REPORT   PREOPERATIVE DIAGNOSIS:  Benign prostatic hypertrophy and urinary  retention.   POSTOPERATIVE DIAGNOSIS:  Benign prostatic hypertrophy and urinary  retention.   PROCEDURE:  Transurethral resection of the prostate.   SURGEON:  Maretta Bees. Vonita Moss, M.D.   ANESTHESIA:  General.   INDICATIONS:  This gentleman had had a long history of bladder outlet  obstructive symptoms and is intolerant of medical therapy and went into  retention, has failed voiding trials, and was finally convinced that he  needs something done interventionally.  He has a large 155 gram prostate  and I told him I would probably just do one lobe and would give him a  good chance to void and avoid an open surgical procedure.  He was  cleared preoperatively cardiac wise by Dr. Juanda Chance.   PROCEDURE IN DETAIL:  The patient was brought to the operating room and  placed in the lithotomy position.  The external genitalia were prepped  and draped in the usual fashion after removing his Foley but getting a  urine specimen for culture.  He received intravenous Cipro.  He was  sounded to [address] [place] without difficulty.  The 28 French resectoscope  sheath was inserted and the bladder was trabeculated but there were no  stones or tumors.  He had a moderate size median lobe and large lateral  lobes.  I resected the bladder neck and median lobe first and then  resected the right lateral lobe down to capsule.  I resected some  anterior tissue.  This took just a little over an hour and I felt that  proceeding to the second lobe would not be advisable at this point and I  felt like he was well  resected on the right side and with the median  lobe resected, he has a good chance of voiding. Residual chips were  removed from the bladder.  The ureteral orifices were seen to be intact.  There was good hemostasis.  The scope was removed  and a 24 French 30 mL Foley inserted with 40 mL placed in the balloon  and traction was placed and there was clear irrigation.  The catheter  was connected to closed drainage.  Estimated blood loss was  approximately 200 mL.  He was taken to the recovery room in good  condition having tolerated the procedure well.      Maretta Bees. Vonita Moss, M.D.  Electronically Signed     LJP/MEDQ  D:  12/21/2006  T:  12/21/2006  Job:  [id number]   cc:   Everardo Beals. Juanda Chance, MD, [facility]  [address]  [place], [place] [address]

## 2010-06-30 NOTE — Assessment & Plan Note (Signed)
[facility]                        [name] OFFICE NOTE   [name], [name]                    MRN:          [id number]  DATE:11/13/2008                            DOB:          1928-07-12    The patient is a 77 year old white male with past medical history  significant for coronary artery disease status post bypass surgery in  1998 with a LIMA to the LAD, SVG to the diagonal, sequential SVG to OM1,  OM2 and distal circumflex and an SVG to PDA who is presenting today with  worsening chest discomfort.  The patient's last left heart  catheterization was in March 2001 and at that time, his left ventricular  function was preserved, he had a patent LIMA to LAD with severe distal  disease, patent diagonal graft it supplies a small diagonal with poor  filling.  There is a patent vein graft to the distal circumflex and  severe disease to the most distal portion of the circumflex system from  the native vessel.  The right coronary artery had a vein graft to the  distal portion that filled slowly.  There was about a 70% proximal  narrowing into an aneurysmal segment, and there was slow filling of the  distal graft.  The patient had a subsequent stress test in 2008 that was  reportedly negative per the patient.  The patient had been in his normal  state of health until the past 3-4 weeks.  The patient notices a  significant increase in generalized weakness.  He also notices very  clear chest discomfort with exertion that resolves with rest.  There are  no associated symptoms and there is no radiation of the discomfort.  He  states every time he walks up his one flight of stairs, he gets chest  discomfort that is relieved with rest.  He was seen yesterday in his  primary care physician's office and there was some EKG changes that were  potentially concerning so, he was asked to come to our clinic today.  The patient denies any lower extremity  edema, PND, orthopnea, dizziness  or syncope.  He also denies any issues with bleeding.   PAST MEDICAL HISTORY:  As above in [place] and also the patient has  hypertension, hyperlipidemia, peripheral arterial disease status post  left carotid endarterectomy.  He has a history of CVA, adenocarcinoma of  the prostate status post XRT, diabetes type 2 and he is status post TURP  of the prostate in 2008.   SOCIAL HISTORY:  No tobacco.  No smoking.   FAMILY HISTORY:  Positive for coronary artery disease.   ALLERGIES:  REGLAN.   MEDICATIONS:  1. Aspirin 81 mg daily.  2. Hydrochlorothiazide 25 mg daily.  3. Amlodipine 10 mg daily.  4. Lisinopril 20 mg b.i.d.  5. Glipizide 10 mg b.i.d.  6. He also has hormone injections every 3 months for prostate cancer      treatment.   REVIEW OF SYSTEMS:  As in HPI.  All other systems were reviewed and are  negative.   PHYSICAL EXAMINATION:  VITAL SIGNS:  Blood pressure 144/86,  his pulse  80, weight 203 pounds, sating 97% on room air.  GENERAL:  In no acute distress.  HEENT:  Nonfocal.  Normocephalic, atraumatic.  NECK:  Supple.  No JVD.  HEART:  Regular rate and rhythm without murmur, rub or gallop.  LUNGS:  Clear bilaterally.  ABDOMEN:  Soft, nontender, nondistended.  EXTREMITIES:  Without edema.  SKIN:  Warm and dry.  NEUROLOGIC:  Nonfocal.  PSYCHIATRIC:  The patient is appropriate with normal levels of insight.   EKG from today independently reviewed by myself demonstrates normal  sinus rhythm with a PVC.  There is left atrial enlargement.  There is a  very large R wave in V2 and V3 with 2-mm ST-segment depression  associated with inverted T waves.  There is also inverted T waves in the  remainder of the precordial leads as well as the high lateral leads.  There is left ventricular hypertrophy at the S wave in V2, it is  approximately 35 mm.  Posterior infarct is less likely as V1 does not  have an R wave.  I believe the traumatic  ST-depression and T wave  inversion is likely secondary to left ventricular hypertrophy.  When  compared with an EKG dated October 2008, the ST-depression in the high  lateral leads is improved currently however, there is now increase in  voltage in the anterior leads with T wave inversion and ST-segment  depression.   ASSESSMENT:  A 77 year old white male with advanced coronary disease  with a negative stress test in 2008 who is now presenting with symptoms  that are consistent with progressive angina.  After extensive discussion  with the patient and the family, we decided to proceed with an adenosine  Cardiolite study.  Should this study be positive, we would proceed with  a left heart catheterization.  Today in clinic, we will check the  appropriate labs including a BMP, CBC, INR, and cardiac markers.  He  should continue on his home medications as listed above.  Of note, his  blood pressure is mildly elevated today and this will need to be  followed.  He is currently not on statin therapy as the patient believes  that this caused some genitourinary issues.  The patient is also  currently not on a beta-blocker and this may be instituted depending on  the results of the stress study.     [name], MD  Electronically Signed    SGA/MedQ  DD: 11/13/2008  DT: 11/14/2008  Job #: [id number]

## 2010-06-30 NOTE — Assessment & Plan Note (Signed)
OFFICE VISIT   NIEVES, BARBERI  DOB:  March 21, 1928                                       12/03/2008  LKGMW#:[id number]   This patient returns today for continued follow-up regarding his carotid  occlusive disease.  Today I ordered a carotid duplex exam which I  reviewed personally and he does have no flow reduction of the left  internal carotid where the surgery was performed previously and has a  moderate 40% to 50% right internal carotid stenosis.  He has a new  problem since last year which is prostate cancer, which has been treated  by radiation therapy and hormonal therapy, managed by Dr. Ballard Russell  and Dr. Larey Dresser.  He denies any neurologic symptoms such as  hemiparesis, aphasia, amaurosis fugax, diplopia, blurred vision, syncope  or interval stroke.  He continues to have some mild right-sided  clumsiness with his writing related to previous stroke which occurred  preoperatively.   REVIEW OF SYSTEMS:  He does have occasional chest discomfort and some  shortness of breath as well as arthritis, depression, nervousness.  All  other systems are negative.  Please see health history form.   SOCIAL HISTORY:  Continues to be free of tobacco use.  He is married and  retired.   PAST MEDICAL HISTORY:  Includes non-insulin diabetes mellitus,  hypertension, hyperlipidemia, coronary artery disease, all of which are  stable.   PHYSICAL EXAMINATION:  Blood pressure 145/81, heart rate 75,  respirations 14.  His carotid pulses are 3+, soft bruit on the right.  Neurologic:  Exam is normal.  No palpable adenopathy in neck.  Chest:  Clear to auscultation.  Cardiovascular:  Regular rhythm, no murmurs.  Abdomen:  Obese.  No palpable masses.   I discussed the findings with him today and obtained information from  his wife and daughter who companied him.  We have ordered a carotid  duplex exam to be performed in 1 year on an annual basis for follow-up.  There  is no treatment necessary at this time but he does need annual  carotid duplex exam.  If he develops any symptoms in the interim, he  will be in touch with [place].   Quita Skye Hart Rochester, M.D.  Electronically Signed   JDL/MEDQ  D:  12/03/2008  T:  12/04/2008  Job:  [id number]

## 2010-06-30 NOTE — Op Note (Signed)
NAMERASMUS, PREUSSER             ACCOUNT NO.:  [id number]   MEDICAL RECORD NO.:  [id number]          PATIENT TYPE:  INP   LOCATION:  2550                         FACILITY:  [facility]   PHYSICIAN:  Quita Skye. Hart Rochester, M.D.  DATE OF BIRTH:  [birth year]   DATE OF PROCEDURE:  02/01/2007  DATE OF DISCHARGE:                               OPERATIVE REPORT   PREOPERATIVE DIAGNOSIS:  Severe left internal carotid stenosis, status  post left brain cerebrovascular accident.   POSTOPERATIVE DIAGNOSIS:  Severe left internal carotid stenosis, status  post left brain cerebrovascular accident.   OPERATION:  Left carotid endarterectomy with Dacron patch angioplasty.   SURGEON:  Quita Skye. Hart Rochester, M.D.   ASSISTANT:  Jerold Coombe, P.A.   ANESTHESIA:  General endotracheal.   BRIEF HISTORY:  This patient suffered a left brain stroke about 3 months  ago consisting of aphasia with no lateralizing weakness.  This was  documented by an MRI scan in November which revealed watershed stroke in  the left hemisphere.  He was found to have a 95+% left internal carotid  stenosis and some mild to moderate disease on the contralateral right  side, was scheduled for left carotid endarterectomy.   PROCEDURE:  The patient taken the operating room, placed in supine  position at which time satisfactory general endotracheal anesthesia was  administered.  The left neck was prepped with Betadine scrub and  solution and draped in routine sterile manner.  Incision was made along  the anterior border of the sternocleidomastoid muscle, carried down  through subcutaneous tissue and platysma using the Bovie.  The common  facial vein and external jugular veins ligated with 3-0 silk ties,  divided exposing the common, internal and external carotid arteries.  Care was taken not to injure the vagus or hypoglossal nerves both of  which were exposed.  There was calcified atherosclerotic plaque at the  carotid bifurcation extending  up the internal carotid about 3 cm.  There  was also a second plaque after about 1 cm skip area which terminated  about another centimeter distally, the distal vessel appeared normal.  #10 shunt prepared and the patient was heparinized.  Carotid vessels  were occluded with vascular clamps longitudinal opening made in the  common carotid with 15 blade, extended up the internal carotid with  Potts scissors to a point distal to the disease.  #10 shunt was inserted  without difficulty reestablishing flow in about 2 minutes.  A standard  endarterectomy was then performed using elevator and Potts scissors with  eversion endarterectomy of the external carotid.  The plaque feathered  off the distal internal carotid artery nicely not requiring any tacking  sutures.  The lumen was thoroughly irrigated with heparin saline.  All  loose debris carefully removed.  Arteriotomy was closed with Dacron  patch using continuous 6-0 Prolene.  Prior to completion of closure the  shunt was removed after about 30 minutes shunt time.  Following  antegrade and retrograde flushing closure was completed reestablishing  flow initially up the external and up the internal branch.  Carotid was  occluded for less than  two minutes for removal of  shunt.  Protamine was then given to reverse the heparin.  Following  adequate hemostasis was irrigated with saline, closed in layers with  Vicryl in the subcuticular fashion.  Sterile dressing applied.  The  patient taken to recovery in satisfactory condition.      Quita Skye Hart Rochester, M.D.  Electronically Signed     JDL/MEDQ  D:  02/01/2007  T:  02/01/2007  Job:  [id number]   cc:   Hillery Aldo, M.D.  Maretta Bees. Vonita Moss, M.D.

## 2010-06-30 NOTE — Assessment & Plan Note (Signed)
OFFICE VISIT   [name], [name]  DOB:  April 20, 1928                                       02/21/2007  ZOXWR#:[id number]   The patient underwent left carotid endarterectomy by me on December 17  for severe left internal carotid stenosis having previously suffered a  left brain stroke, documented by MRI scan in November 2007.  This was a  watershed stroke, which had occurred following neurologic procedure for  adenoma with carcinoma to the prostate.  He had had some speech  difficulties even prior to that time, and his symptoms changed after his  prostatectomy, at which time the stroke was detected.  He has done well  since his surgery with no new neurological symptoms.  He is ambulating  well.  Has no hoarseness and is swallowing well.  He has had no right-  sided symptoms.  He continues to take an 81 mg aspirin tablet a day.   PHYSICAL EXAM:  Blood pressure 124/72, heart rate 76, respirations 18.  His carotid pulses are 3+ with a soft bruit on the left, no bruit on the  right.  Left neck incision is healed nicely.  Neurologic exam is  unremarkable.   I have reassured him regarding these findings.  We will see him back in  6 months for followup carotid duplex exam unless he develops any new  neurologic symptoms in the interim.   Quita Skye Hart Rochester, M.D.  Electronically Signed   JDL/MEDQ  D:  02/21/2007  T:  02/22/2007  Job:  [id number]   cc:   Maretta Bees. Vonita Moss, M.D.  Dereck Leep, NP

## 2010-06-30 NOTE — Procedures (Signed)
CAROTID DUPLEX EXAM   INDICATION:  Follow up carotid artery disease.   HISTORY:  Diabetes:  Yes.  Cardiac:  Yes.  Hypertension:  Yes.  Smoking:  No.  Previous Surgery:  Left carotid endarterectomy with a DPA 02/01/2007 by  Dr. Hart Rochester.  CV History:  Patient says currently is asymptomatic.  Amaurosis Fugax No, Paresthesias No, Hemiparesis No.                                       RIGHT             LEFT  Brachial systolic pressure:         146               148  Brachial Doppler waveforms:         WNL               WNL  Vertebral direction of flow:        Antegrade         Antegrade  DUPLEX VELOCITIES (cm/sec)  CCA peak systolic                   53                81  ECA peak systolic                   210               67  ICA peak systolic                   511               69  ICA end diastolic                   207               12  PLAQUE MORPHOLOGY:                  Heterogenous  PLAQUE AMOUNT:                      Moderate-to-severe  PLAQUE LOCATION:                    ICA, ECA, CCA   IMPRESSION:  1. Right internal carotid artery stenosis is 80% to 99%.  2. Patent and durable left carotid endarterectomy.  3. Right external carotid artery stenosis.   Discussed these findings with Dr. Darrick Penna.   ___________________________________________  Quita Skye Hart Rochester, M.D.   OD/MEDQ  D:  02/19/2010  T:  02/19/2010  Job:  [id number]

## 2010-06-30 NOTE — Assessment & Plan Note (Signed)
[facility]                        [place] [facility] OFFICE NOTE   NIKOLAY, [name]                    MRN:          [id number]  DATE:11/20/2008                            DOB:          Apr 12, 1928    Mr. Debarr is a 77 year old white male with past medical history  significant for coronary artery disease, status post CABG in 1998.  His  last left heart catheterization was in March 2001 showed a preserved EF,  patent LIMA to the LAD, patent diagonal graft that supplies a diagonal  with poor filling, patent vein graft to the distal circumflex, and vein  graft to the right coronary artery then had a 70% proximal narrowing  into an aneurysmal segment with slow distal filling.  The patient  initially presented at the end of September with some increased chest  discomfort on exertion.  He subsequently underwent an adenosine  Cardiolite study that showed a normal ejection fraction and mild  ischemia in the inferior lateral wall.  The patient states that since  his last visit, his chest pain has improved somewhat.  He continues to  get occasional chest discomfort with exertion, but states that any chest  discomfort at rest is extremely rare.  The patient states he checks his  blood pressure at home and the systolic was in the 120s-130s and he  believes this to be too low, so he has not been taking his  antihypertensive medications.  In general, the patient is somewhat  resistant to taking medications regularly.  Other than the chest  discomfort, the patient states that his energy has been drained, but he  believes this is secondary to hormonal shot that he has been receiving  to prevent recurrence of prostate cancer.  He states that he believes he  has received the last of the shots as he has undergone a 1-year course.   The patient denies any increase in dyspnea and lower extremity edema.  He also denies any syncope.   PHYSICAL EXAMINATION:  VITAL  SIGNS:  Today, the patient's blood pressure  is 162/79, his pulse is 70, he weighs 203 pounds, he is sating 97% on  room air.  GENERAL:  No acute distress.  HEENT:  Nonfocal.  HEART:  Distant heart sounds with regular rate and rhythm without  murmur, rub, or gallop.  LUNGS:  Clear to auscultation bilaterally.  ABDOMEN:  Soft, nontender, nondistended.  EXTREMITIES:  Without edema.  SKIN:  Cool and dry.   Review of the patient's stress test as above in HPI.   ASSESSMENT AND PLAN:  The patient continues to have some symptoms that  are consistent with angina and his stress test only showed mild ischemia  in the inferior wall.  Secondary to the somewhat increased risk, the  patient would be for heart catheterization as he has a history of a  significant peripheral arterial disease.  We will attempt to control his  angina with medical therapy first.  The patient is strongly encouraged  to take his antihypertensive medications regularly.  He is currently on  aspirin 81 mg daily, HCTZ 25 mg daily, amlodipine  10 mg daily,  lisinopril 20 mg b.i.d.  Today in clinic, we will start Imdur 30 mg  daily.  The patient is also not on a statin and states that he strongly  believes that a statin caused his prostate to swell in the past and he  is hesitant to start it.  He does state that he is most willing to take  Zocor as he thinks to see if this one had the less side effects for him  in the past.  Therefore, we will start him on Zocor 20 mg daily and get  the results of his last lipid profile.  We will see the patient back in  2 weeks' time in order to evaluate the patient's angina.  In the future,  Ranexa may be a good option to help control his angina.     [name], MD  Electronically Signed    SGA/MedQ  DD: 11/20/2008  DT: 11/21/2008  Job #: [id number]

## 2010-06-30 NOTE — Assessment & Plan Note (Signed)
[facility]                        [place] [facility] OFFICE NOTE   STEFANOS, HAYNESWORTH                    MRN:          [id number]  DATE:08/22/2009                            DOB:          [birth year]    PROBLEMS LIST:  1. Coronary artery disease with chronic angina, status post coronary      artery bypass surgery in 1998.  Most recent cardiac catheterization      was done in October 2010, after he presented with non-ST-elevation      myocardial infarction.  That catheterization showed an occluded      vein graft to the right coronary artery, occluded vein graft to the      diagonal, patent  left internal mammary artery graft to left anterior descending coronary  artery and a patent saphenous vein graft to a small diagonal.  According  to the cath report, the vein graft to 2 marginal branches was subtotally  occluded with large burden of thrombus.  It was felt that angioplasty  would be very high risk.  Medical therapy was recommended.  There was  also significant disease in the distal left anterior descending coronary  artery.  Ejection fraction was 45%.  1. Ischemic cardiomyopathy with mildly reduced left ventricular      systolic function.  Ejection fraction 45%.  2. Peripheral vascular disease status post left carotid      endarterectomy.  3. Hypertension.  4. Hyperlipidemia.   INTERVAL HISTORY:  Mr. Floren returns for followup visit.  Overall, he  has been doing reasonably well.  According to him, his chest pain and  angina has improved over the last few months gradually.  He continues  though to get almost daily chest pain.  He uses 1 or 2 nitroglycerin a  day.  Most of his chest pain is with light activities.  In spite of all  of that, he actually feels better than before.  He was started on Ranexa  by Dr. Freida Busman, however, he did not tolerate the medication due to GI  symptoms.  He stopped the medication on his own and has not taken it  in  many months.  He was also admitted briefly to nursing home and it seems  like some of his medications were changed at that time.  He is not on  metoprolol anymore for unclear reasons.  He walks with a cane and  overall he has significant physical limitations.  He also stopped taking  simvastatin and does not want to take any cholesterol medication due to  myalgia.  Overall, it seems like he really does not like taking  medications and I suspect there might be some compliance issues.   CURRENT MEDICATIONS:  1. Glipizide 10 mg twice daily.  2. Aspirin 81 mg daily.  3. Clopidogrel 75 mg day.  4. Imdur 60 mg daily.  5. Lisinopril 20 mg twice daily.  6. Amlodipine 10 mg daily.  7. Lasix 20 mg once daily.  8. Nitroglycerin sublingual as needed.   PHYSICAL EXAMINATION:  VITAL SIGNS:  Weight is 201.6 pounds.  Blood  pressure is 153/88 in the  left arm.  Pulse is 79 beats per minute.  Oxygen saturation is 96% on room air.  NECK:  Reveals a surgical scar on the left side with no bruits  bilaterally.  There is no JVD.  LUNGS:  Clear to auscultation.  CARDIOVASCULAR:  Regular rate and rhythm with no gallops or murmurs.  ABDOMEN:  Benign, nontender, nondistended.  EXTREMITIES:  No clubbing, cyanosis, or edema.   Electrocardiogram showed sinus rhythm with first-degree AV block.  There  is left axis deviation.  There is ST and T-wave changes in the  anterolateral leads suggestive of ischemia.  These changes actually  looked less prominent than his previous ECG.   ASSESSMENT AND PLAN:  1. Chronic class III angina with known history of coronary artery      disease.  Most recent cardiac catheterization in October 2010.  Two      vein grafts were occluded and one was subtotally occluded with      significant thrombus burden.  No revascularization was advised at      that time.  We will continue with aggressive medical therapy.      Unfortunately, he has stopped taking Ranexa due to  gastrointestinal      symptoms.  He also stopped taking statins due to myalgias.  The      patient does not want to resume any of these 2 medications.  He is      off metoprolol for unclear reason, I will go ahead and resume      metoprolol tartrate 25 mg twice daily which might help improve his      chest pain.  Although he is using nitroglycerin almost on a daily      basis, he actually feels better than before and according to him,      his chest pain is improving with time.  2. Hypertension:  Blood pressure is elevated.  Hopefully, this will      improve with metoprolol.  3. Hyperlipidemia, off statins due to myalgias.  The patient is not      willing to try another medication.  He will return back in 6 months      or earlier if needed.     Lorine Bears, MD  Electronically Signed    MA/MedQ  DD: 08/22/2009  DT: 08/23/2009  Job #: [id number]

## 2010-06-30 NOTE — Consult Note (Signed)
[name], [name] NO.:  [id number]   MEDICAL RECORD NO.:  [id number]          PATIENT TYPE:  INP   LOCATION:  1428                         FACILITY:  [facility]   PHYSICIAN:  Hillery Aldo, M.D.   DATE OF BIRTH:  [birth year]   DATE OF CONSULTATION:  12/23/2006  DATE OF DISCHARGE:                                 CONSULTATION   PRIMARY CARE PHYSICIAN:  The patient sees a nurse practitioner name  Dorothy at [facility].  He has seen Dr. Sherril Croon, cardiology, in  the past.   REASON FOR CONSULTATION:  Postoperative confusion, hyperglycemia,  hypertension, gout.   HISTORY OF PRESENT ILLNESS:  The patient is a 77 year old male with a  past medical history of gout, coronary artery disease, diabetes,  hypertension, and benign prostatic hypertrophy with urinary retention  who was admitted on December 21, 2006, by Dr. Vonita Moss for an elective  transurethral resection of the prostate secondary to obstructive  uropathy and urinary retention.  The patient has done well  postoperatively.  He was cleared for surgery by Dr. Riley Kill of  cardiology.  Over the past 24 hours, the patient has complained of  increasing pain in the right ankle area progressing to inability to walk  secondary to pain consistent with his prior history of gout flare.  The  patient denies being confused, but the family does report that he has  had some intermittent confusion postoperatively.  His blood glucoses  have been running high.  The patient states that he does check his  sugars at home twice a day with a meter and adjusts his Glipizide dose  to once or twice a day depending on his blood glucose values.   PAST MEDICAL HISTORY:  1. Coronary artery disease/myocardial infarction status post CABG in      1998.  The most recent cardiac catheterization in March 2001 showed      recurrent disease, medically managed.  Preserved LV function.  2. Atrial fibrillation/flutter post CABG status post DC  cardioversion.  3. Diabetes.  4. Gout.  5. Dyslipidemia, untreated  6. Hypertension.  7. History of CVA in 1998 with some residual dysarthria.  8. Benign prostatic hypertrophy with urinary retention, status post      TURP.  9. History of tonsillectomy and adenoidectomy.  10.Osteoarthritis status post total right hip replacement.   CURRENT MEDICATIONS:  1. Glipizide 10 mg daily to b.i.d.  2. Hydrochlorothiazide 25 mg daily.  3. Lisinopril 20 mg daily.  4. Indomethacin p.r.n.  5. Occasional Goody Powders.   ALLERGIES:  FINASTERIDE, STATINS.   FAMILY HISTORY:  The patient's father died at 29 from heart disease.  The patient's mother died in her mid 62s from old age.  He has multiple  sisters with diabetes.  One has had a stroke.  The other has had  angioplasty for coronary artery disease.   SOCIAL HISTORY:  The patient is married and lives with his wife.  He is  a life long nonsmoker.  He denies alcohol or drug use.  He is a retired  Midwife.   REVIEW OF SYSTEMS:  The  patient denies any fever or chills.  His  appetite has been good.  No nausea or vomiting.  No diarrhea.  His  bowels have not moved since the surgery.  He denies any chest pain.  He  has occasional dyspnea.  No cough.  No polyuria or polydipsia.  His main  complaint is right ankle pain and inability to bear weight on the right  ankle secondary to pain.   PHYSICAL EXAM:  VITAL SIGNS:  Temperature 98.6, blood pressure 152/77,  pulse 75, respirations 20, O2 saturation 94% on room air.  CBG values  ranged from 114 to 224.  GENERAL:  Well developed, well nourished male in no acute distress.  HEENT:  Normocephalic, atraumatic.  PERRL.  EOMI.  Visual fields are  full.  Tongue was midline.  NECK:  Supple, no thyromegaly, no lymphadenopathy, no jugular venous  tension.  CHEST:  The lungs are clear to auscultation bilaterally.  Good air  movement.  HEART:  Regular rate and rhythm.  No murmurs.  ABDOMEN:  Soft,  nontender, nondistended with normoactive bowel sounds.  GENITOURINARY:  The patient has a Foley catheter in place with bloody  urine draining.  EXTREMITIES:  No clubbing, edema, cyanosis.  He has TED hose on.  He is  tender about the right medial ankle.  NEUROLOGICAL:  The patient is alert and oriented x3.  Cranial nerves II-  XII appear to be grossly intact.  He is able to move his upper  extremities with equal strength.  Diminished lower extremity strength  and exam limited secondary to pain in the ankle.  He does have dysmetria  with finger-nose-finger.  He correctly follows two step commands.  Although he has a history of dysarthria from his old stroke, I could not  detect any changes in his speech currently.  SKIN:  Dry.  No rashes.   DATA REVIEW.:  Urine culture is growing Pseudomonas aeruginosa, Cipro  sensitive, as well as Enterococcus.  Sodium is 139, potassium 4,  chloride 102, bicarb 32, BUN 12, creatinine 1.2, glucose 264.  White  blood cell count is 8.2, hemoglobin 13.3, hematocrit 37.7, platelets  135.   ASSESSMENT AND PLAN:  1. Altered mental status:  The patient's altered mental status appears      to be transient and is likely due to his recent surgery with the      side effects of sedating medications/anesthesia with possible      underlying mild vascular dementia.  His urinary tract infection      could also be contributing.  There is no obvious focal deficits on      exam.  I did not specifically assess his gait due to pain but would      assess it tomorrow after his gout has been treated and if his gait      is still unstable or worrisome, would get an MRI of his brain to      rule out CVA.  Would also put him on low dose aspirin therapy once      it is okay to do so from the perspective of the urologist.  Given      that he is having ongoing hematuria, would not start this until the      urologist okays it.  2. Gout:  The patient clearly has a history of gout  and his current      ankle pain is consistent with this.  He is on indomethacin at home  which normally clears it up after one dose.  We will start him on      indomethacin p.r.n.  We will need to monitor his renal function      closely on this medication.  3. Type 2 diabetes:  Will start sliding scale insulin as his sugars      are somewhat suboptimally controlled.  Will check a hemoglobin A1c      value to determine his overall glycemic control and adjust his      medications as needed.  4. Hypertension:  The patient's systolic blood pressure is currently      slightly elevated.  This may be due to pain.  If this continues to      be a persistent pattern, would adjust his antihypertensive      medications.   Thank you for this consult.  We will follow the patient with you.      Hillery Aldo, M.D.  Electronically Signed     CR/MEDQ  D:  12/23/2006  T:  12/24/2006  Job:  [id number]   cc:   [facility]

## 2010-06-30 NOTE — Assessment & Plan Note (Signed)
[facility]                            CARDIOLOGY OFFICE NOTE   [name], [name]                    MRN:          [id number]  DATE:12/07/2006                            DOB:          1928/11/24    Patient is here for pre-surgical clearance.   [name] is a 77 year old married white male patient with a history  of coronary artery disease, who we have not seen since 2001.  He had  been followed by Dr. Sherril Croon back then but has not seen a cardiologist  since 2001.  He has a history of coronary artery disease, status post  CABG in 1998, by Dr. Tyrone Sage with a LIMA to the LAD, SVG to the  diagonal, and a sequential SVG to the OM 1, OM 2, and distal circumflex  and an SVG to the PDA.   He returned with recurrent chest pain in 2001 and underwent cardiac  catheterization, which revealed preserved LV function, a patent LIMA to  th LAD with severe distal disease, a patent diagonal graft that supplies  a small, tiny diagonal with very poor filling.  He had unprotected long  disease to the ramus intermediate.  He had a patent SVG to the distal  circumflex system.  He had severe disease in the most distal portion of  the circumflex system from the native vessel.  There was slow filling of  the graft to the distal RCA with the diffusely diseased distal right  vessel.  He had a number of areas that were felt to be ischemic,  particularly the distal LAD and distal circumflex territories, and  aggressive medical therapy was recommended.   The patient, as stated above, has not seen a cardiologist since that  time and denies any significant chest pain, palpitations, dizziness,  dyspnea, dyspnea on exertion, or pre-syncope.  He occasionally twice a  year will develop chest heaviness if he becomes aggravate or angry about  something.  He says if he lays down, it dissipates quickly.  He is quite  inactive.  He does not walk or do much of anything, according to  his  family.  He now is having significant prostate trouble and has had an  indwelling catheter for the past month.  He needs to have an inpatient  TURP and maybe resection of one lobe of the prostate with possibly  second stage resection of the other lobe.   The patient has an elevated lipid profile and has been intolerant to  medications in the past.  He says ZOCOR and LIPITOR caused his prostate  to hurt.  Dr. Daralene Milch has given him multiple samples to try, but his wife  says he refuses to try them  He also refuses to take a coated aspirin a  day and has not been on aspirin for the past seven years.  Currently, he  is taking some BC powder for pain.   ALLERGIES:  REGLAN.   CURRENT MEDICATIONS:  1. Glipizide 10 mg 1-2 daily.  2. Lisinopril 20 mg b.i.d.  3. Amlodipine 5 mg daily.   PAST MEDICAL HISTORY:  1. Hypertension.  2. Diabetes mellitus.  3. Hyperlipidemia, untreated.  4. Significant coronary artery disease.  5. Gout.  6. Prostate enlargement.  7. Stroke in March, 1999 with some residual leg weakness.   SOCIAL HISTORY:  He lives with his wife.  He is very inactive.  He is a  nonsmoker and does not drink alcohol.   REVIEW OF SYSTEMS:  He has pain in his left foot from gout.  He has an  indwelling catheter for his current prostate problems.  Otherwise, he  denies any dizziness, presyncopal signs or symptoms, dyspepsia,  dysphagia, nausea, vomiting, change in bowels or melena.  CARDIOPULMONARY:  Please see HPI.   PHYSICAL EXAMINATION:  This is a pleasant 77 year old white male in no  acute distress.  Blood pressure 124/88, pulse 80, weight 203.  NECK:  Without JVD, HJR, bruit, or thyroid enlargement.  LUNGS:  Clear posterior and lateral.  HEART:  Regular rate and rhythm at 70 beats per minute.  Normal S1 and  S2.  Positive S4.  Distant heart sounds.  No murmur heard.  ABDOMEN:  Soft without organomegaly, masses, lesions, or abnormal  tenderness.  EXTREMITIES:  Without  clubbing, cyanosis or edema.  He has good distal  pulses.   EKG:  Normal sinus rhythm with LVH and nonspecific ST-T wave changes.  No acute change from EKG back in 2001.   IMPRESSION:  1. Need for transurethral resection of the prostate and possible      resection of one lobe of the prostate.  2. Coronary artery disease, status post coronary artery bypass graft      in 1998 with catheterization in 2001 showing a number of areas of      progression of coronary artery disease.  3. Hypertension, treated.  4. Hyperlipidemia, untreated.  Unable to tolerate.  Cholesterol-      lowering medication and noncompliance.  5. Diabetes mellitus.  6. Gout.  7. History of stroke in March, 1999.   PLAN:  At this time, we recommend a stress Cardiolite.  Patient says he  can walk on a treadmill, but this may be switched to Adenosine with his  current foot pain and gout.  If this is okay, Dr. Juanda Chance felt that he  would be at reasonable risk to proceed with the prostate surgery.  We also recommend that the patient resume taking a daily coated aspirin  as well as lipid-lowering agent after his prostate surgery.      Jacolyn Reedy, PA-C  Electronically Signed      Everardo Beals. Juanda Chance, MD, [facility]  Electronically Signed   ML/MedQ  DD: 12/07/2006  DT: 12/08/2006  Job #: [id number]   cc:   Dr. Dorena Dew in [place]. Vonita Moss, M.D.

## 2010-06-30 NOTE — H&P (Signed)
[name], [name]             ACCOUNT NO.:  [id number]   MEDICAL RECORD NO.:  [id number]          PATIENT TYPE:  INP   LOCATION:  NA                           FACILITY:  [facility]   PHYSICIAN:  Maretta Bees. Vonita Moss, M.D.DATE OF BIRTH:  August 26, 1928   DATE OF ADMISSION:  12/21/2006  DATE OF DISCHARGE:                              HISTORY & PHYSICAL   HISTORY OF PRESENT ILLNESS:  This is a 77 year old gentleman has had a  long history of bladder outlet obstructive symptoms.  She has been  treated medically with Avodart and Flomax, but he did not tolerate those  drugs very well.  He has continued bladder outlet obstructive symptoms.  He is going to retention requiring a Foley catheter.  He has pulled the  catheter out on his own, and all-in-all he has tolerated medical therapy  and catheters poorly.  He has a quite large prostate measuring 156 grams  on ultrasound and long discussions with the patient and his family  finally convinced him that he needed something done about his voiding.  He opted for TUR of the prostate and perhaps sustained one lobe that may  correct the problem.  He has also had previous prostate biopsies that  have been benign.  He has had urodynamics.  He showed an obstructive  pressure flow pattern.  He has been cleared by Dr. Charlies Constable from a  cardiac point of view because of his coronary disease.  Also,  preoperative chest x-ray showed the question of a nodule or bony  prominence in the left lung base and a copy of the chest x-ray report  was given to the patient and his family to take to the primary care  physician for further evaluation.   PAST MEDICAL HISTORY:  1. History of coronary disease.  2. Chronic anxiety.  3. Arthritis.  4. Atrial fibrillation.  5. Diabetes mellitus.  6. Gout.  7. Hypercholesterolemia.  8. Hypertension.  9. Ischemic stroke.   PREVIOUS SURGERIES:  1. Coronary bypass graft.  2. T&A.  3. Total hip replacement   CURRENT  MEDICATIONS:  1. Glipizide 10 mg daily.  2. Hydrochlorothiazide 25 mg daily.  3. Lisinopril 20 mg daily.  4. He uses p.r.n. medications for gout.   ALLERGIES:  HE CANNOT TOLERATE UROXATRAL, FINASTERIDE OR LIPITOR.   FAMILY HISTORY:  Diabetes and heart disease.   SOCIAL HISTORY:  He does not drink alcohol or smoke cigarettes.   REVIEW OF SYSTEMS:  Noted on health history form.   PHYSICAL EXAMINATION:  VITAL SIGNS:  Blood pressure is 170/80, he is  afebrile, heart rate is 80.  GENERAL:  He is alert and oriented.  SKIN:  Warm, dry, no acute distress.  HEART:  Tones have occasional irregular beat.  LUNGS:  Normal respiratory rate rhythm.  ABDOMEN:  Soft, nontender, no hepatosplenomegaly or hernias or inguinal  nodes.  Penis, urethral meatus, scrotum, testicles and epididymis  remarkable for Foley catheter in place.  Perineum is normal.  Sphincter  tone is normal.  Prostate is quite large and feels smooth.   IMPRESSION:  1. Benign prostatic hypertrophy and  urinary retention.  2. Coronary disease.  3. Anxiety.  4. Arthritis.  5. Atrial fibrillation.  6. Diabetes.  7. Gout.  8. Hypercholesterolemia.  9. Hypertension and history of stroke.   PLAN:  TUR of prostate.      Maretta Bees. Vonita Moss, M.D.  Electronically Signed     LJP/MEDQ  D:  12/21/2006  T:  12/21/2006  Job:  [id number]

## 2010-06-30 NOTE — Procedures (Signed)
CAROTID DUPLEX EXAM   INDICATION:  Followup carotid artery disease.   HISTORY:  Diabetes:  Yes  Cardiac:  Yes  Hypertension:  Yes  Smoking:  No  Previous Surgery:  Left CEA with DPA 02/01/2007 by Dr. Hart Rochester  CV History:  Stroke in past, asymptomatic now.  Amaurosis Fugax No, Paresthesias No, Hemiparesis No                                       RIGHT             LEFT  Brachial systolic pressure:         140               144  Brachial Doppler waveforms:         WNL               WNL  Vertebral direction of flow:        Not well-visualized                 Antegrade  DUPLEX VELOCITIES (cm/sec)  CCA peak systolic                   84                96  ECA peak systolic                   225               99  ICA peak systolic                   150               83  ICA end diastolic                   47                29  PLAQUE MORPHOLOGY:                  Heterogenous      Homogenous  PLAQUE AMOUNT:                      Moderate          None in ICA  PLAQUE LOCATION:                    ICA/ECA/CCA       ECA   IMPRESSION:  1. Right internal carotid artery shows evidence of 40-59% stenosis.  2. Left internal carotid artery shows no evidence of restenosis status      post carotid endarterectomy.  3. Right external carotid artery stenosis.  4. No significant changes from previous study.        ___________________________________________  Quita Skye Hart Rochester, M.D.   AS/MEDQ  D:  12/03/2008  T:  12/03/2008  Job:  [id number]

## 2010-06-30 NOTE — Letter (Signed)
November 13, 2008    Audrea Muscat, [place]  [facility]  [address]  [place], [place] [address]   RE:  [name], [name]  MRN:  [id number]  /  DOB:  10/05/1928   Dear [name]:   I am writing to you regarding your patient, [name], whom I saw  in Cardiology Clinic today.  As you know, he is a 77 year old white male  with a history of extensive coronary disease who is presenting with  symptoms of progressive angina.  Fortunately at this time, his chest  pain is only with exertion and it is rather easily relieved with rest.  After extensive discussion with the family, we have elected to proceed  with an adenosine Cardiolite study.  Should this study indicate  inducible ischemia, we would at that time proceed with a left heart  catheterization, otherwise we will advance medical treatment for angina.   Thank you for the referral of this patient.  We look for to following  him along with you.    Sincerely,      [name], MD  Electronically Signed    SGA/MedQ  DD: 11/13/2008  DT: 11/13/2008  Job #: [id number]

## 2010-06-30 NOTE — Assessment & Plan Note (Signed)
[facility]                         CARDIOLOGY OFFICE NOTE   REY, DANSBY                    MRN:          [id number]  DATE:12/17/2008                            DOB:          06/07/1928    HISTORY:  Mr. Lindon is a 77 year old white male with history of  coronary artery disease status post CABG in 1998, hypertension,  hyperlipidemia, peripheral artery disease status post left carotid  endarterectomy, and history of CVA who is presenting today after  discharge from the hospital on December 14, 2008 after presenting with a  non-ST segment elevation MI.  The patient was admitted to the hospital  with worsening chest discomfort and nausea.  His troponin was found to  be 2.7.  He underwent a left heart catheterization that showed an  occluded vein graft to the RCA, occluded vein graft to diagonal, a  patent LIMA to the LAD, and a saphenous vein graft to another diagonal  that is subtotally occluded with heavy thrombus burden.  I was felt that  intervention would be high risk and that may not be necessary as the  patient did have some collaterals to the area supplied by that  subtotally occluded vein graft to diagonal.  The patient states since he  has been home, he continues to have chest pain on a daily basis.  He has  it almost predictably with exertion and he is taking several  nitroglycerin tablets every day.  He has been compliant with the rest of  his medications, although he is unhappy to be on so many medications.  He endorses bilateral ankle swelling that is chronic in nature and  relieved by raising his feet.  He denies any syncopal episodes.  He  endorses some fatigue.   PHYSICAL EXAMINATION:  VITAL SIGNS:  Blood pressure is 118/56, pulse is  66, he weighs 205 pounds, and he is sating 95% on room air.  GENERAL:  He is in no acute distress.  HEENT:  Nonfocal.  NECK:  Supple.  HEART:  Regular rate and rhythm with distant  heart sounds.  LUNGS:  Clear bilaterally.  ABDOMEN:  Soft, nontender, and nondistended.  EXTREMITIES:  Without edema. There is bilateral ankle edema, but nothing  proximal to the ankles.  SKIN:  Warm and dry.   MEDICATION LIST:  1. Aspirin 81 mg daily.  2. Plavix 75 mg daily.  3. Metoprolol 25 mg b.i.d.  4. Lisinopril 20 mg daily.  5. Simvastatin 40 mg p.o. at bedtime.  6. Imdur 60 mg daily.   ASSESSMENT AND PLAN:  The patient continues to have chronic angina with  exertion.  His blood pressure is well controlled.  His last LDL was 223  in July of this year and he is currently on Zocor 40 mg daily.  Zocor  was chosen because this was the only statin he says that he would be  willing to take.  The patient continues to have angina and the Imdur has  not appeared to provide any relief.  Today, we will institute a trial of  Ranexa 500 mg twice a day for 1  week and then he should increase to 1000  mg twice a day.  Samples are given for this.  We have also arranged for  a home health to see the patient in the house to assist with blood draws  and to evaluate safety.  We will see the patient back in 2 months' time  and he should contact our office in the interim if any problems were to  arise.  Also, if the Ranexa provides relief, his family will contact our  office and we will call in a prescription.  Lastly, he is given a  prescription for Lasix 20 mg daily, which he can take p.r.n. for the  ankle edema.     [name], MD  Electronically Signed    SGA/MedQ  DD: 12/17/2008  DT: 12/18/2008  Job #: [id number]

## 2010-06-30 NOTE — Assessment & Plan Note (Signed)
OFFICE VISIT   [name], [name]  DOB:  [birth year]                                       12/05/2007  ZOXWR#:[id number]   This is an office visit.  The patient returns now almost 1 year post  left carotid endarterectomy for severe left internal carotid stenosis  having previously suffered a left brain stroke 3 months prior to his  surgery.  He has had no new neurologic symptoms since the surgery.  He  has recently started on cardiac rehab.  He had coronary artery bypass  grafting performed in 1998 by Dr. Tyrone Sage and is stable from that  standpoint.  He does not have any worsening lower extremity symptoms.  His diabetes, hypertension and hyperlipidemia continue to be controlled  medically.  He takes one aspirin per day.   PHYSICAL EXAMINATION:  Vital signs:  On physical exam today blood  pressure is 151/74, heart rate is 85, respirations are 14.  Neck:  His  carotid pulses are 3+ with bruits bilaterally but soft on the left side.  Neurologic:  Is unremarkable with some slowness in his speech.  Abdomen:  Soft, nontender with no palpable masses.  He has 3+ femoral pulses  bilaterally with well-perfused lower extremities.   Carotid duplex exam today reveals no evidence of restenosis in the left  internal carotid.  Right carotid has an approximate 50% stenosis.   I have reassured him and his family regarding this and we will see him  on an annual basis on the carotid protocol followup unless he develops  any new symptoms in the interim.   Quita Skye Hart Rochester, M.D.  Electronically Signed   JDL/MEDQ  D:  12/05/2007  T:  12/06/2007  Job:  [id number]

## 2010-06-30 NOTE — H&P (Signed)
HISTORY AND PHYSICAL EXAMINATION   January 31, 2007   Re:  [name], [name]             DOB:  1971-03-01   CHIEF COMPLAINT:  Severe left carotid occlusive disease with recent left  brain CVA.   HISTORY OF PRESENT ILLNESS:  This 77 year old male patient underwent a  TURP by Dr. Larey Dresser in November, 2008 for adenocarcinoma of the  prostate.  He had been having some speech difficulties for about 4-5  weeks prior to that time with no change in his symptoms following the  prostate surgery, but during that hospitalization, he underwent an  evaluation and had an MRI scan performed, which revealed evidence of an  acute watershed distribution left brain CVA as well as an old left  occipital and cerebellar stroke.  Carotid duplex exam was performed on  November 11th, which revealed a 95+% left internal carotid artery  stenosis with minimal flow reduction on the right side.  He has had no  further neurologic symptoms since that time, including hemiparesis,  amaurosis fugax, diplopia, blurred vision, syncope, and his speech  deficit has continued to improve, according to him and his family.   PAST MEDICAL HISTORY:  1. Diabetes mellitus, non-insulin-dependent.  2. Hypertension.  3. Hyperlipidemia.  4. Coronary artery disease, status post myocardial infarction in 1998.  5. Previous stroke in 2000.  6. Negative for congestive heart failure or COPD.   PAST SURGICAL HISTORY:  1. Coronary artery bypass grafting by Dr. Tyrone Sage in 1998.  2. TURP in 2008 by Dr. Vonita Moss.  3. Left hip replacement.  4. Tonsillectomy.   FAMILY HISTORY:  Positive for coronary artery disease and myocardial  infarction in his father, who died at age 88.  Stroke in his sister.  Diabetes mellitus in an aunt.   SOCIAL HISTORY:  He is married.  He has three children.  He is retired.  He does not use tobacco or alcohol.   REVIEW OF SYSTEMS:  Patient denies any chest pain but does have  mild  dyspnea on exertion with active walking.  He has no hemoptysis, chronic  cough, wheezing, asthma, hematemesis, melena, reflux esophagitis.  Does  have recent urinary symptoms, as noted above, related to his TURP.  He  has a history of depression and nervousness but no other problems noted.   ALLERGIES:  REGLAN.   MEDICATIONS:  1. Glipizide 10 mg b.i.d.  2. Lisinopril 20 mg b.i.d.  3. Amlodipine 5 mg 1 q.a.m.  4. Lorazepam 0.5 mg p.r.n.  5. Hydrochlorothiazide 25 mg 1 daily.   PHYSICAL EXAMINATION:  Blood pressure 156/89, heart rate 81,  respirations 18.  Temperature 97.2.  Generally, he is an elderly male  who is in no apparent distress.  He is alert and oriented x3.  His neck  is supple with 3+ carotid pulses with a harsh bruit on the left side.  Neurologic exam reveals slightly slow speech but relatively clear and  does retrieve words well.  He has excellent strength bilaterally with no  sensory deficits noted.  There is no palpable adenopathy in the neck.  Chest:  Clear to auscultation.  Cardiovascular:  Regular rhythm with no  murmurs.  Upper extremity pulses are 3+ at the brachial and radial  levels bilaterally.  No skin rash noted.  Abdomen is soft and nontender  with no palpable masses.  He has 3+ femoral pulses bilaterally with well-  perfused lower extremities.   Carotid duplex exam was  performed on December 27, 2006 was reviewed.  Agree that he does have a very severe left internal carotid artery  stenosis.   IMPRESSION:  1. A 95+% left internal carotid artery stenosis with recent left brain      cerebrovascular accident (2-3 months ago).  2. Coronary artery disease, status post coronary artery bypass      grafting, currently asymptomatic.  Negative Cardiolite exam      performed in October, 2008 prior to urologic surgery.  3. Hypertension.  4. Hyperlipidemia.  5. Type 2 diabetes mellitus.  6. History of previous stroke in 2000.   PLAN:  Admit the patient on  December 17th for an elective left carotid  endarterectomy.  The risks and benefits have been thoroughly discussed  with the patient and his family, and they would like to proceed.   Quita Skye Hart Rochester, M.D.  Electronically Signed   JDL/MEDQ  D:  01/31/2007  T:  02/01/2007  Job:  [id number]   cc:   Maretta Bees. Vonita Moss, M.D.  Shirley Muscat, NP

## 2010-07-03 NOTE — Cardiovascular Report (Signed)
[place]. [facility]  Patient:    [name], [name]                    MRN: [id number] Proc. Date: 05/08/99 Adm. Date:  [id number] Attending:  Pricilla Riffle CC:         Arturo Morton. Riley Kill, M.D. [facility]             Luis Abed, M.D. [facility]             Earl Many, M.D.                        Cardiac Catheterization  INDICATIONS:  Mr. Hardacre was transferred from [facility] with unstable angina.  He underwent revascularization surgery by Dr. Tyrone Sage.  The current study was done to assess coronary anatomy.  The surgery was done approximately three years ago, at which time he had a LIMA to the LAD; saphenous vein graft to the diagonal; sequential saphenous vein graft to the OM1, OM2 and distal circumflex; and saphenous vein graft to the PDA.  PROCEDURES: 1. Left heart catheterization. 2. Selective coronary angiography. 3. Saphenous vein graft angiography. 4. Selective left internal mammary angiography. 5. Selective left ventriculography.  PROCEDURAL DATA:  The procedure was performed from the right femoral artery using 6-French catheters.  He tolerated the procedure without complication.  He did require pain medication for back discomfort.  HEMODYNAMIC DATA: 1. Central aortic pressure:  140/81. 2. Left ventricular pressure:  135/21. No gradient on pullback across the aortic valve.  VENTRICULOGRAPHY:  Done in the RAO projection, reveals preserved global systolic function.  ANGIOGRAPHIC RESULTS: 1. Left Main Coronary Artery:  Has mild calcification, but is not critically    diseased. 2. Left Anterior Descending Artery:  Demonstrates competitive flow and has a    50% proximal stenosis; subtotally occluded distally prior to the origin of    the left internal mammary.  The septal perforator has a 90% proximal stenosis    and the small diagonal has a 90% stenosis.  The left internal mammary to the    distal LAD is widely patent; however, there  is diffuse, severe disease of 90%    distal to the graft insertion.  There is a 70% distal stenosis. 3. Ramus Intermedius:  Large and demonstrates diffuse 90% proximal stenosis. There    are several distal branches which are moderate in size.  There is a saphenous    vein graft to a diagonal, but it is not clear as to whether it supplies the    intermediate (it is a very small-caliber graft, with very slow filling because    of poor distal runoff). 4. Circumflex Artery:  The AV circumflex has 50% narrowing in the mid portion, 0%    narrowing in the distal portion, and then 90% stenosis just prior to the distal    circumflex origin.  There is a sequential saphenous vein graft that goes to he    OM1, OM2 and distal circumflex.  The OM1 appears to be a very small vessel and    fills slowly.  The OM2 and distal circumflex are large-caliber vessels. There    is about a 50% stenosis at the origin of the OM2.  The distal circumflex does    not appear to be the absolute distal-most portion of the circumflex system.  There is a small distal circumflex from the AV circumflex that is noted on the  native angiogram. 5. Right Coronary Artery:  Severely diseased throughout.  There is poor flow    distally after two 90% stenoses.  The saphenous vein graft to the distal right    is filled slowly as well.  There is about 70% proximal narrowing into an    aneurysmal segment, and then there is slow filling of the distal graft.  CONCLUSIONS: 1. Preserved left ventricular function. 2. Patent internal mammary to the LAD, with severe distal disease. 3. Patent diagonal graft that supplies a small, tiny diagonal with very poor    filling. 4. Unprotected, long disease to the ramus intermedius. 5. Patent saphenous vein graft to the distal circumflex system. 6. Severe disease of the most-distal portion of the circumflex system from the    native vessel. 7. Slow filling of the graft to the distal  right coronary, with a diffusely    diseased distal right vessel.  DISPOSITION:  This is a difficult situation.  I am not sure that he would benefit by redo surgery.  However, there are a number areas that are ischemic, in particular the distal LAD, distal circumflex territories and intermediate territories; all appear ischemic.  Aggressive medical therapy would be warranted. DD:  05/08/99 TD:  05/11/99 Job: [id number]

## 2010-07-03 NOTE — Discharge Summary (Signed)
[name], [name]             ACCOUNT NO.:  [id number]   MEDICAL RECORD NO.:  [id number]          PATIENT TYPE:  INP   LOCATION:  3309                         FACILITY:  [facility]   PHYSICIAN:  Quita Skye. Hart Rochester, M.D.  DATE OF BIRTH:  08-27-1928   DATE OF ADMISSION:  02/01/2007  DATE OF DISCHARGE:  02/02/2007                               DISCHARGE SUMMARY   ADMISSION DIAGNOSIS:  Severe left internal carotid artery stenosis  status post left brain cerebrovascular accident.   DISCHARGE/SECONDARY DIAGNOSES:  1. Severe left internal carotid artery stenosis status post left brain      cerebrovascular accident status post left carotid endarterectomy.  2. Diabetes mellitus, type 2, non-insulin-dependent.  3. Hypertension.  4. Hyperlipidemia.  5. Coronary artery disease status post myocardial infarction in 1998      and coronary artery bypass grafting by Dr. Sheliah Plane in 1998.  6. Previous stroke in 2000.  7. Transurethral resection of the prostate in 2008 by Dr. Vonita Moss.  8. History of left hip replacement.  9. History of tonsillectomy.  10.History of adenocarcinoma of the prostate.   ALLERGIES:  REGLAN.   PROCEDURES:  February 01, 2007, a left carotid endarterectomy with  Dacron patch angioplasty by Dr. Josephina Gip.   BRIEF HISTORY:  Mr. Vallee is a 77 year old male, who underwent TURP by  Dr. Larey Dresser in November of 2008, for adenocarcinoma of the  prostate.  He had been having some speech difficulties for about four to  five weeks prior to that time with no change in his symptoms following  the prostate surgery, but during the hospitalization he underwent an  evaluation and had an MRI scan, which revealed evidence of an acute  watershed distribution left brain CVA as well as an old left occipital  and cerebellar stroke.  A carotid duplex was performed on November 11th,  which revealed at least 95% left internal carotid artery stenosis with  minimal flow reduction  on the right side.  He at that point was referred  to Dr. Josephina Gip.  Following events during that hospitalization, he  had no further neurologic symptoms and, according to some of his family,  his speech deficit continued to improve.  Based on the severity of his  stenosis and recent left brain CVA, Dr. Hart Rochester recommended that he  undergo a left carotid endarterectomy.   HOSPITAL COURSE:  Mr. Kuhner was electively admitted to [facility] on February 01, 2007, he underwent the previously mentioned  procedure.  Postoperatively, he was extubated at his neurologic  baseline, and after a short stay in the recovery unit was transferred to  step-down unit 3300 where he remained until discharge.  He had an  uneventful postoperative course.  On postoperative day one, vitals  showed a blood pressure of 145/65, oxygen saturation 98% on 2 liters per  nasal cannula, which was up some and subsequently weaned.  He was  afebrile, heart rate was in the 60s and 70s and in sinus rhythm with  intermittent first-degree heart block.  Labs showed a white count of  6.7, hemoglobin 12.4, hematocrit of  36.3, platelet count 139.  Sodium  138, potassium 4.1, BUN 127, creatinine 1.18 (which improved from his  preoperative creatinine of 1.59), and glucose of 158.  He denied  dysphagia and reported that he was comfortable and had no nausea or  vomiting.  He was able to void following surgery and ambulated without  difficulty.  Incisions showed no evidence of hematoma.  His tongue was  midline, and he was moving all extremities without any trouble.  He was  subsequently felt appropriate for discharge home later that day,  postoperative day one, February 02, 2007.  He was discharged home in  stable condition.   DISCHARGE MEDICATIONS:  1. Tylox one to two tablets p.o. q.6h p.r.n. pain.  2. Coated aspirin 325 mg p.o. daily.  3. Glipizide 10 mg p.o. b.i.d. as needed based on his blood sugar.  4. Lisinopril  20 mg b.i.d.  5. Amlodipine 5 mg q.a.m.  6. Lorazepam 0.5 mg p.r.n. personal management.  7. Hydrochlorothiazide 25 mg p.o. q.a.m.  8. Indomethacin 25 mg p.r.n. for his home regimen.   DISCHARGE INSTRUCTIONS:  1. He is to continue to do a diabetic appropriate diet.  2. May shower and clean his incisions gently with soap and water.  3. Avoid driving or heavy lifting for the next two weeks.  4. Increase activities slowly.  5. Call if he develops fever greater than 101, redness or drainage      from the incision site, severe headaches, or new neurologic      changes.  6. He will see Dr. Hart Rochester in followup in approximately two weeks; our      office will contact him regarding the specific appointment date and      time.      Jerold Coombe, P.A.      Quita Skye Hart Rochester, M.D.  Electronically Signed    AWZ/MEDQ  D:  02/28/2007  T:  02/28/2007  Job:  [id number]

## 2010-07-03 NOTE — Discharge Summary (Signed)
[place]. [facility]  Patient:    [name], [name]                    MRN: [id number] Adm. Date:  [id number] Disc. Date: 05/11/99 Attending:  Pricilla Riffle Dictator:   Abelino Derrick, P.A.C. [facility] CC:         Jennelle Human, M.D., [address], [place]             [facility]                           Discharge Summary  DISCHARGE DIAGNOSES: 1. Coronary disease with some progression demonstrated by catheterization this    admission, to be treated medically. 2. Hyperlipidemia. 3. Hypertension. 4. Coronary disease, status post coronary artery bypass grafting in 1998. 5. Non-insulin-dependent diabetes, diet controlled.  HOSPITAL COURSE:  The patient is a 77 year old male with bypass surgery of November 1998 x 6.  He had good LV function.  He had postoperative PIF requiring DC cardioversion.  In March 1999, he had a stroke and was treated with Plavix.  He was admitted May 07, 1999 with recurrent chest pain that was exertional and typical of angina.  He relates that he had recently stopped his Lopressor as an outpatient. The patient was admitted to telemetry.  CK-MBs and troponins were obtained. The patient was maintained on Lovenox.  He was set up for catheterization.  Beta blocker and Imdur were added.  Catheterization was done May 08, 1999 by Dr. Riley Kill which revealed patent LIMA to LAD with 90% narrowing in the native LAD after the graft insertion, patent SVG to the diagonal, patent SVG to OM and circumflex, and patent SVG to the RCA with 50% narrowing after the graft insertion. He had normal LV function.  Films were reviewed with Dr. Myrtis Ser and Dr. Riley Kill.  Plan was for exercise Cardiolite study on medications.  The patient had a exercise Cardiolite May 11, 1999.  He walked six minutes to the Bruce protocol and achieved a maximum heart rate of 112 which is less than 85% of his age predicted maximum heart rate.  He did have slight ST  elevation in V2 and some chest pain t peak exercise.  Cardiolite images showed an inferior defect but no significant ischemia.  Films were reviewed again with Dr. Juanda Chance and in conjunction with the Cardiolite data, it was recommended at this point that we continue medical therapy. Dr. Juanda Chance feels that he could attempt angioplasty to the native LAD via the LIMA for refractory symptoms, although it would be somewhat difficult and probably have a high rate of restenosis.  This was discussed with the patient in detail and he understands.  DISCHARGE MEDICATIONS: 1. Prinivil 20 mg b.i.d. 2. Imdur 60 mg a day. 3. Coated aspirin q.d. 4. Plavix 75 mg a day. 5. Hydrochlorothiazide 25 mg a day. 6. Nitroglycerin sublingual p.r.n. 7. Lopressor 25 mg twice a day.  LABORATORY DATA:  HDL 44, LDL 130.  CK-MB and troponins are negative.  White count 5.8, hemoglobin 16, hematocrit 44, platelet count 198, INR is 1.1.  Sodium 138,  potassium 4.1, BUN 20, creatinine 0.8.  Urinalysis unremarkable.  Chest x-ray shows no active disease.  EKG shows normal sinus rhythm with T wave inversion in V2, V3, V4, V5, lead I, nd aVL.  DISPOSITION:  The patient is discharged in stable condition and will follow up ith Dr. Sherril Croon.  DD:  05/11/99 TD:  05/11/99 Job: [id number]

## 2010-07-03 NOTE — Discharge Summary (Signed)
[name], [name]             ACCOUNT NO.:  [id number]   MEDICAL RECORD NO.:  [id number]          PATIENT TYPE:  INP   LOCATION:  1428                         FACILITY:  [facility]   PHYSICIAN:  Maretta Bees. Vonita [name], M.D.DATE OF BIRTH:  1928-10-29   DATE OF ADMISSION:  12/21/2006  DATE OF DISCHARGE:  12/26/2006                               DISCHARGE SUMMARY   FINAL DIAGNOSES:  1. Bladder outlet obstruction.  2. Carcinoma of the prostate.  3. Coronary artery disease.  4. Chronic anxiety.  5. Arthritis.  6. Atrial fibrillation.  7. Diabetes mellitus.  8. Gout.  9. Hypercholesterolemia.  10.Hypertension.  11.History of ischemic stroke.  12.Carotid artery disease.  13.Urinary tract infection.   PROCEDURE:  Transurethral resection of prostate on the December 21, 2006.   HISTORY:  This gentleman has had a long history of bladder outlet  obstructive symptoms, has failed therapy on Flomax and Avodart.  He is  finally convinced that he needs TUR of the prostate but he had a large  prostate on ultrasound and was told we will probably just do a partial  resection that may completely correct his problem.  He has other  multiple medical problems.   PHYSICAL EXAMINATION:  Noncontributory except for an occasional  irregular pulse and a quite enlarged, benign-feeling prostate.   HOSPITAL COURSE:  After admission he underwent TUR of the prostate and  also at that time he was found to have a positive urine culture and put  on Cipro for therapy that eventually was stopped due to a question of  whether it was causing some confusion in the hospital.  He was evaluated  in the hospital by [facility] and was found to have carotid artery  disease, and also an MRI of the brain showed some problems with acute  watershed distribution of strokes in the left hemisphere and extensive  small-vessel disease and old cortical strokes.  During the course of his  hospital stay we also treated him with  Zosyn but when he was discharged,  we felt that we would try and on Cipro at home and make sure it did not  cause confusion as he did need his urinary tract treated for infection.   He was sent home in improved condition, having voided satisfactorily  after his Foley catheter was removed.   He will continue on his usual medications of:  1. Glipizide 10 mg daily.  2. Hydrochlorothiazide 25 mg daily.  3. Lisinopril 20 mg daily.  4. He will treat his gout p.r.n.   I should add that an episode of gout in the hospital require therapy  with anti-inflammatories.  He will go home on limited activity and a  heart-healthy diet.  He will return to the office to see me in 2 weeks.  We will need to get him referred to the vascular surgeons to evaluate  his carotid arteries.      Maretta Bees. Vonita [name], M.D.  Electronically Signed     LJP/MEDQ  D:  01/09/2007  T:  01/10/2007  Job:  [id number]

## 2010-07-03 NOTE — H&P (Signed)
. [facility]  Patient:    [name], [name]                    MRN: [id number] Adm. Date:  [id number] Attending:  Cathren Laine Dictator:   Abelino Derrick, P.A.C. [facility]                         History and Physical  CHIEF COMPLAINT:  Chest pain.  HISTORY OF PRESENT ILLNESS:  [name] is a 77 year old male, followed by [name], and seen at [facility], who had bypass surgery in November of 1998.  He had LIMA to the LAD, SVG to the RCA, SVG to the diagonal nd SVG to the OM-1 and OM-2 and OM-3.  Surgeon was [name].  His cardiologist is [name].  His EF was 50% at the time of his surgery. Postoperatively, he had atrial flutter and ended up being D-C cardioverted, December 28, 1996, to sinus rhythm.  He was discharged on January 01, 1997 on eta blocker, Zocor, Glucotrol and aspirin.  He apparently had a stroke in March of 998 and was seen by [name].  Plavix was added at that time.  He has done  well since.  About a month ago, he tapered off his Lopressor on his own.  He said his lower blood pressure was dropping into the 60s, which was a change for him. He did have some fatigue and there is an office note from [name] that says that he felt better off his beta blocker.  They tried to get him to resume  this but he declined.  Yesterday, he was walking to the chicken coop and developed substernal chest discomfort.  Symptoms were relieved with rest.  This morning, e got up to walk his dog and had the same symptoms, again relieved with rest. There is no history of nausea, vomiting, diaphoresis or shortness of breath.  He has ot tried nitroglycerin.  He called his doctors office who referred him to the emergency room.  He is pain-free now.  PAST MEDICAL HISTORY:  His past medical history is remarkable for non-insulin-dependent diabetes; he is currently on diet only.  He has a  history of hypertension and hyperlipidemia.  He has had no prior major surgeries.  CURRENT MEDICATIONS: 1. Hydrochlorothiazide 25 mg a day. 2. Zocor 10 mg a day. 3. Prinivil 10 mg b.i.d. 4. Plavix 75 mg a day. 5. Aspirin q.d.  ALLERGIES:  He has no known drug allergies.  SOCIAL HISTORY:  He is married, a nonsmoker and [place].  FAMILY HISTORY:  His father died at 55 with a history of an MI.  His sister has a history of coronary disease and has had angioplasty.  REVIEW OF SYSTEMS:  Essentially unremarkable except for noted above.  There is o history of GI bleeding or peptic ulcer disease.  There is no history of prostate problems, thyroid disease or renal disease.  PHYSICAL EXAMINATION:  VITAL SIGNS:  Blood pressure 141/78, pulse 58 and regular, respirations 18.  GENERAL:  He is a well-developed, well-nourished male in no acute distress.  HEENT:  Normocephalic.  Extraocular movements are intact.  Sclerae are nonicteric. Lids and conjunctivae are within normal limits.  NECK:  Without bruit and without JVD.  CHEST:  Clear to auscultation and percussion.  CARDIAC:  Regular rate and rhythm, without murmur, rub  or gallop.  Normal S1 and S2.  ABDOMEN:  Nontender.  No hepatosplenomegaly.  EXTREMITIES:  Without edema.  There are no femoral artery bruits noted.  NEUROLOGIC:  Grossly intact.  He is awake, alert, oriented and cooperative.  LABORATORY AND X-RAY FINDINGS:  His EKG in the emergency room reveals normal sinus rhythm, sinus bradycardia, with T wave inversion, I, aVL, V2 and V3, which is similar to EKGs from November of 1998.  Chest x-ray and labs are pending.  IMPRESSION: 1. Chest pain, consistent with new-onset angina. 2. Known coronary disease, status post coronary artery bypass grafting, November    1998. 3. Postoperative atrial flutter, cardioverted prior to discharge at the time of his    bypass. 4. History of a stroke in March of 1999, some  residual leg weakness. 5. Hypertension. 6. Non-insulin-dependent diabetes. 7. Hyperlipidemia.  PLAN:  Will admit to telemetry and rule out for an MI.  Will add Norvasc for angina and hypertension.  We may want to consider starting a low dose of beta blocker also.  He may need cardiac catheterization; this will be discussed with the attending cardiologist. DD:  05/07/99 TD:  05/07/99 Job: [id number]

## 2010-09-03 ENCOUNTER — Encounter: Payer: Self-pay | Admitting: Vascular Surgery

## 2010-09-29 ENCOUNTER — Other Ambulatory Visit (INDEPENDENT_AMBULATORY_CARE_PROVIDER_SITE_OTHER): Payer: Medicare Other

## 2010-09-29 ENCOUNTER — Encounter: Payer: Self-pay | Admitting: Vascular Surgery

## 2010-09-29 ENCOUNTER — Ambulatory Visit (INDEPENDENT_AMBULATORY_CARE_PROVIDER_SITE_OTHER): Payer: Medicare Other | Admitting: Vascular Surgery

## 2010-09-29 VITALS — BP 166/79 | HR 84 | Ht 75.0 in | Wt 203.0 lb

## 2010-09-29 DIAGNOSIS — Z48812 Encounter for surgical aftercare following surgery on the circulatory system: Secondary | ICD-10-CM

## 2010-09-29 DIAGNOSIS — I6529 Occlusion and stenosis of unspecified carotid artery: Secondary | ICD-10-CM

## 2010-09-29 NOTE — Progress Notes (Signed)
Subjective:     Patient ID: [name], male   DOB: 1928/09/22, 77 y.o.   MRN: [id number]  HPI 77 year old male patient has had staged carotid endarterectomies over the past 4 years returns for followup. He denies any neurologic symptoms such as unilateral weakness, a fascia, visual loss, or syncope. He has no history of stroke. He will occasionally have chest discomfort and takes nitroglycerin on weekly basis. He takes aspirin and Plavix daily.  Past Medical History  Diagnosis Date  . Diabetes mellitus   . Hypertension   . Hyperlipidemia   . Coronary artery disease   . Cancer of prostate   . Stroke 2000    History  Substance Use Topics  . Smoking status: Never Smoker   . Smokeless tobacco: Never Used  . Alcohol Use: No    Family History  Problem Relation Age of Onset  . Heart disease Father   . Stroke Sister     Allergies  Allergen Reactions  . Codeine   . Reglan     Current outpatient prescriptions:amLODipine (NORVASC) 10 MG tablet, Take 10 mg by mouth at bedtime.  , Disp: , Rfl: ;  aspirin (ASPIRIN EC) 81 MG EC tablet, Take 81 mg by mouth daily.  , Disp: , Rfl: ;  clopidogrel (PLAVIX) 75 MG tablet, Take 75 mg by mouth daily.  , Disp: , Rfl: ;  furosemide (LASIX) 20 MG tablet, Take 1 tablet (20 mg total) by mouth daily., Disp: 30 tablet, Rfl: 6 glipiZIDE (GLUCOTROL) 10 MG tablet, Take 10 mg by mouth 2 (two) times daily before a meal.  , Disp: , Rfl: ;  isosorbide dinitrate (ISORDIL) 30 MG tablet, Take 30 mg by mouth. Take 2 tabs bid , Disp: , Rfl: ;  lisinopril (PRINIVIL,ZESTRIL) 20 MG tablet, Take 20 mg by mouth 2 (two) times daily.  , Disp: , Rfl: ;  LORazepam (ATIVAN) 1 MG tablet, Take 1 mg by mouth at bedtime as needed.  , Disp: , Rfl:  nitroGLYCERIN (NITROSTAT) 0.4 MG SL tablet, Place 0.4 mg under the tongue every 5 (five) minutes as needed.  , Disp: , Rfl:   Filed Vitals:   09/29/10 1443  Height: 6\' 3"  (1.905 m)  Weight: 203 lb (92.08 kg)    Body mass index  is 25.37 kg/(m^2).         Review of Systems  Constitutional: Positive for fatigue.  HENT: Positive for hearing loss.   Eyes: Positive for pain.  Respiratory: Positive for chest tightness.   Cardiovascular: Positive for chest pain.  Gastrointestinal: Negative.   Genitourinary: Positive for urgency, decreased urine volume and difficulty urinating.  Musculoskeletal: Negative.   Neurological: Positive for headaches.  Hematological: Bruises/bleeds easily.  Psychiatric/Behavioral: Negative.        Objective:   Physical Exam  Constitutional: He is oriented to person, place, and time. He appears well-developed and well-nourished. No distress.  HENT:  Head: Normocephalic.  Mouth/Throat: Oropharynx is clear and moist.  Eyes: EOM are normal.  Neck: Normal range of motion. Neck supple.  Cardiovascular: Normal rate, regular rhythm and intact distal pulses.   No murmur heard. Pulmonary/Chest: Effort normal and breath sounds normal. No respiratory distress. He has no wheezes. He has no rales.  Abdominal: Soft. He exhibits no distension and no mass. There is no tenderness. There is no rebound and no guarding.  Musculoskeletal: Normal range of motion. He exhibits no edema.  Lymphadenopathy:    He has no cervical adenopathy.  Neurological: He  is alert and oriented to person, place, and time. Coordination normal.  Skin: Skin is warm and dry. No rash noted.  Psychiatric: His behavior is normal.  Blood pressure 166/79, pulse 84, height 6\' 3"  (1.905 m), weight 203 lb (92.08 kg).     Assessment:     Today I ordered a carotid duplex exam which are reviewed and interpreted. The internal carotid arteries bilaterally are widely patent. The right external carotid artery has a severe stenosis.    Plan:     Asymptomatic post-bilateral carotid endarterectomy Patient return in 6 months for carotid duplex exam. Following this will return for annual carotid duplex study. If he develops symptoms  will be in touch with [place] at that time.

## 2010-10-09 NOTE — Procedures (Unsigned)
CAROTID DUPLEX EXAM  INDICATION:  Followup bilateral CEA.  HISTORY: Diabetes:  Yes. Cardiac:  Yes. Hypertension:  Yes. Smoking:  No. Previous Surgery:  Left CEA 02/01/2007; right CEA 03/18/2010. CV History:  No. Amaurosis Fugax No, Paresthesias No, Hemiparesis No                                      RIGHT             LEFT Brachial systolic pressure:         170               162 Brachial Doppler waveforms:         WNL               WNL Vertebral direction of flow:        Abnormal antegrade                  Abnormal antegrade DUPLEX VELOCITIES (cm/sec) CCA peak systolic                   61                113 ECA peak systolic                   317               101 ICA peak systolic                   27                49 ICA end diastolic                   10                16 PLAQUE MORPHOLOGY:                  Heterogeneous     Heterogeneous PLAQUE AMOUNT:                      Mild to moderate  Minimal PLAQUE LOCATION:                    ECA               CCA  IMPRESSION: 1. Widely patent bilateral carotid endarterectomy without evidence of     restenosis or hyperplasia. 2. Mild diffuse soft plaquing of the left common carotid artery     proximal to the carotid endarterectomy start point. 3. Bilateral vertebral arteries are abnormal.  Suspect occlusion of     the right vertebral artery. 4. Right external carotid artery is abnormal.  ___________________________________________ [name]. Hart Rochester, M.D.  LT/MEDQ  D:  09/29/2010  T:  09/29/2010  Job:  [id number]

## 2010-11-05 ENCOUNTER — Other Ambulatory Visit: Payer: Self-pay | Admitting: *Deleted

## 2010-11-05 ENCOUNTER — Encounter: Payer: Self-pay | Admitting: Cardiovascular Disease

## 2010-11-05 MED ORDER — NITROGLYCERIN 0.4 MG SL SUBL: 0.4000 mg | SUBLINGUAL_TABLET | SUBLINGUAL | Status: AC | PRN

## 2010-11-20 LAB — COMPREHENSIVE METABOLIC PANEL
AST: 15
Albumin: 3.7
Alkaline Phosphatase: 112
Chloride: 103
Creatinine, Ser: 1.59 — ABNORMAL HIGH
GFR calc Af Amer: 51 — ABNORMAL LOW
Potassium: 4
Total Bilirubin: 0.9
Total Protein: 7

## 2010-11-20 LAB — CROSSMATCH: Antibody Screen: NEGATIVE

## 2010-11-20 LAB — CBC
MCV: 85.1
Platelets: 139 — ABNORMAL LOW
Platelets: 176
RBC: 4.27
WBC: 6.7
WBC: 7.1

## 2010-11-20 LAB — BASIC METABOLIC PANEL
BUN: 27 — ABNORMAL HIGH
Calcium: 8.3 — ABNORMAL LOW
Chloride: 101
Creatinine, Ser: 1.18
GFR calc Af Amer: >60
GFR calc non Af Amer: >60

## 2010-11-20 LAB — ABO/RH: ABO/RH(D): O NEG

## 2010-11-20 LAB — APTT: aPTT: 38 — ABNORMAL HIGH

## 2010-11-24 LAB — URINE CULTURE: Colony Count: >=100000

## 2010-11-24 LAB — BASIC METABOLIC PANEL
BUN: 13
BUN: 18
CO2: 27
CO2: 32
CO2: 34 — ABNORMAL HIGH
Calcium: 8.9
Chloride: 102
Chloride: 103
Creatinine, Ser: 1.28
GFR calc Af Amer: >60
GFR calc Af Amer: >60
GFR calc non Af Amer: 54 — ABNORMAL LOW
GFR calc non Af Amer: 55 — ABNORMAL LOW
Glucose, Bld: 215 — ABNORMAL HIGH
Glucose, Bld: 264 — ABNORMAL HIGH
Potassium: 3.3 — ABNORMAL LOW
Potassium: 3.9
Sodium: 139
Sodium: 140
Sodium: 140

## 2010-11-24 LAB — CBC
HCT: 33.8 — ABNORMAL LOW
Hemoglobin: 11.8 — ABNORMAL LOW
Hemoglobin: 13.3
Hemoglobin: 15.8
MCHC: 34.7
MCHC: 35.1
MCV: 83.9
MCV: 84.6
Platelets: 138 — ABNORMAL LOW
Platelets: 145 — ABNORMAL LOW
RBC: 4.49
RBC: 5.38
RDW: 14.1 — ABNORMAL HIGH
RDW: 14.3 — ABNORMAL HIGH
WBC: 5.2
WBC: 8.3

## 2010-11-24 LAB — COMPREHENSIVE METABOLIC PANEL
ALT: 10
AST: 10
CO2: 29
Chloride: 102
GFR calc Af Amer: >60
GFR calc non Af Amer: 53 — ABNORMAL LOW
Sodium: 138
Total Bilirubin: 1.2

## 2010-11-24 LAB — URINALYSIS, ROUTINE W REFLEX MICROSCOPIC
Glucose, UA: NEGATIVE
Ketones, ur: NEGATIVE
Protein, ur: >300 — AB
pH: 6

## 2010-11-24 LAB — URINE MICROSCOPIC-ADD ON

## 2011-03-30 ENCOUNTER — Other Ambulatory Visit (INDEPENDENT_AMBULATORY_CARE_PROVIDER_SITE_OTHER): Payer: Medicare Other | Admitting: *Deleted

## 2011-03-30 DIAGNOSIS — I6529 Occlusion and stenosis of unspecified carotid artery: Secondary | ICD-10-CM

## 2011-03-30 DIAGNOSIS — Z48812 Encounter for surgical aftercare following surgery on the circulatory system: Secondary | ICD-10-CM

## 2011-04-12 ENCOUNTER — Other Ambulatory Visit: Payer: Self-pay | Admitting: *Deleted

## 2011-04-12 DIAGNOSIS — I6529 Occlusion and stenosis of unspecified carotid artery: Secondary | ICD-10-CM

## 2011-04-12 DIAGNOSIS — Z48812 Encounter for surgical aftercare following surgery on the circulatory system: Secondary | ICD-10-CM

## 2011-04-13 ENCOUNTER — Encounter: Payer: Self-pay | Admitting: Vascular Surgery

## 2011-04-13 NOTE — Procedures (Unsigned)
CAROTID DUPLEX EXAM  INDICATION:  Follow up bilateral carotid endarterectomies.  HISTORY: Diabetes:  Yes. Cardiac:  Yes. Hypertension:  Yes. Smoking:  No. Previous Surgery:  Left carotid endarterectomy 02/01/2007, right carotid endarterectomy 03/18/2010. CV History:  Currently asymptomatic. Amaurosis Fugax No, Paresthesias No, Hemiparesis No                                      RIGHT             LEFT Brachial systolic pressure:         168               162 Brachial Doppler waveforms:         Normal            Normal Vertebral direction of flow:        Abnormal antegrade                  Abnormal antegrade DUPLEX VELOCITIES (cm/sec) CCA peak systolic                   59                69 ECA peak systolic                   201               60 ICA peak systolic                   40                56 ICA end diastolic                   17                18 PLAQUE MORPHOLOGY:                  Heterogenous      Heterogenous PLAQUE AMOUNT:                      Mild/moderate     Minimal PLAQUE LOCATION:                    ECA               CCA  IMPRESSION: 1. Widely patent bilateral carotid endarterectomy sites with no     evidence of re-stenosis. 2. Mild diffuse soft plaquing of the left common carotid artery     proximal to the endarterectomy site. 3. Right external carotid artery stenosis. 4. Stable in comparison to the previous exam.  ___________________________________________ Quita Skye. Hart Rochester, M.D.  EM/MEDQ  D:  03/31/2011  T:  03/31/2011  Job:  [id number]

## 2011-08-05 ENCOUNTER — Other Ambulatory Visit: Payer: Self-pay | Admitting: Cardiovascular Disease

## 2012-03-29 ENCOUNTER — Ambulatory Visit: Payer: Medicare Other | Admitting: Neurosurgery

## 2012-03-29 ENCOUNTER — Other Ambulatory Visit: Payer: Medicare Other

## 2012-04-11 ENCOUNTER — Encounter: Payer: Self-pay | Admitting: Neurosurgery

## 2012-04-12 ENCOUNTER — Other Ambulatory Visit (INDEPENDENT_AMBULATORY_CARE_PROVIDER_SITE_OTHER): Payer: Medicare Other | Admitting: *Deleted

## 2012-04-12 ENCOUNTER — Encounter: Payer: Self-pay | Admitting: Neurosurgery

## 2012-04-12 ENCOUNTER — Ambulatory Visit (INDEPENDENT_AMBULATORY_CARE_PROVIDER_SITE_OTHER): Payer: Medicare Other | Admitting: Neurosurgery

## 2012-04-12 DIAGNOSIS — I6529 Occlusion and stenosis of unspecified carotid artery: Secondary | ICD-10-CM | POA: Insufficient documentation

## 2012-04-12 DIAGNOSIS — Z48812 Encounter for surgical aftercare following surgery on the circulatory system: Secondary | ICD-10-CM

## 2012-04-12 NOTE — Progress Notes (Signed)
[facility] Carotid Office Note  CC: Carotid surveillance Referring Physician: Hart Rochester  History of Present Illness: 79 year old male patient of Dr. Hart Rochester status post left CEA in 2008 and a right CEA in 2012. The patient denies any signs or symptoms of CVA, TIA, amaurosis fugax. The patient denies any new medical diagnoses or recent surgery.  Past Medical History  Diagnosis Date  . Diabetes mellitus   . Hypertension   . Hyperlipidemia   . Coronary artery disease   . Cancer of prostate   . Stroke 2000    ROS: [x]  Positive   [ ]  Denies    General: [ ]  Weight loss, [ ]  Fever, [ ]  chills Neurologic: [ ]  Dizziness, [ ]  Blackouts, [ ]  Seizure [ ]  Stroke, [ ]  "Mini stroke", [ ]  Slurred speech, [ ]  Temporary blindness; [ ]  weakness in arms or legs, [ ]  Hoarseness Cardiac: [ ]  Chest pain/pressure, [ ]  Shortness of breath at rest [ ]  Shortness of breath with exertion, [ ]  Atrial fibrillation or irregular heartbeat Vascular: [ ]  Pain in legs with walking, [ ]  Pain in legs at rest, [ ]  Pain in legs at night,  [ ]  Non-healing ulcer, [ ]  Blood clot in vein/DVT,   Pulmonary: [ ]  Home oxygen, [ ]  Productive cough, [ ]  Coughing up blood, [ ]  Asthma,  [ ]  Wheezing Musculoskeletal:  [ ]  Arthritis, [ ]  Low back pain, [ ]  Joint pain Hematologic: [ ]  Easy Bruising, [ ]  Anemia; [ ]  Hepatitis Gastrointestinal: [ ]  Blood in stool, [ ]  Gastroesophageal Reflux/heartburn, [ ]  Trouble swallowing Urinary: [ ]  chronic Kidney disease, [ ]  on HD - [ ]  MWF or [ ]  TTHS, [ ]  Burning with urination, [ ]  Difficulty urinating Skin: [ ]  Rashes, [ ]  Wounds Psychological: [ ]  Anxiety, [ ]  Depression   Social History History  Substance Use Topics  . Smoking status: Never Smoker   . Smokeless tobacco: Never Used  . Alcohol Use: No    Family History Family History  Problem Relation Age of Onset  . Heart disease Father 29    Heart Disease before age 20  . Stroke Sister   . Diabetes  Sister   . Heart disease Brother   . Diabetes Brother   . Heart disease Brother   . Diabetes Brother   . Heart disease Brother   . Diabetes Brother   . Heart disease Brother   . Diabetes Brother     Allergies  Allergen Reactions  . Lipitor (Atorvastatin) Other (See Comments)    GI  And  " pt could not talk"  . Reglan (Metoclopramide) Other (See Comments)    Trimmers " pt can't talk"  . Zocor (Simvastatin)     GI  . Codeine Nausea And Vomiting  . Metoclopramide Hcl     Current Outpatient Prescriptions  Medication Sig Dispense Refill  . amitriptyline (ELAVIL) 10 MG tablet Take 10 mg by mouth at bedtime.      Marland Kitchen amLODipine (NORVASC) 10 MG tablet Take 10 mg by mouth at bedtime.        . Aspirin-Salicylamide-Caffeine (BC HEADACHE POWDER PO) Take 845 mg by mouth as needed.      Marland Kitchen glipiZIDE (GLUCOTROL) 10 MG tablet Take 10 mg by mouth 2 (two) times daily before a meal.        . lisinopril (PRINIVIL,ZESTRIL) 20 MG tablet Take 20 mg by mouth 2 (two) times daily.        Marland Kitchen  LORazepam (ATIVAN) 1 MG tablet Take 1 mg by mouth at bedtime as needed.        . nitroGLYCERIN (NITROSTAT) 0.4 MG SL tablet Place 1 tablet (0.4 mg total) under the tongue every 5 (five) minutes as needed.  25 tablet  2  . aspirin (ASPIRIN EC) 81 MG EC tablet Take 81 mg by mouth daily.        . clopidogrel (PLAVIX) 75 MG tablet Take 75 mg by mouth daily.        . furosemide (LASIX) 20 MG tablet Take 1 tablet (20 mg total) by mouth daily.  30 tablet  6  . isosorbide dinitrate (ISORDIL) 30 MG tablet Take 30 mg by mouth. Take 2 tabs bid        No current facility-administered medications for this visit.    Physical Examination  Filed Vitals:   04/12/12 1528  BP: 148/87  Pulse: 75  Resp:     Body mass index is 24.64 kg/(m^2).  General:  WDWN in NAD Gait: Normal HEENT: WNL Eyes: Pupils equal Pulmonary: normal non-labored breathing , without Rales, rhonchi,  wheezing Cardiac: RRR, without  Murmurs, rubs or  gallops; Abdomen: soft, NT, no masses Skin: no rashes, ulcers noted  Vascular Exam Pulses: 3+ radial pulses bilaterally Carotid bruits: Carotid pulses to auscultation no bruits are heard Extremities without ischemic changes, no Gangrene , no cellulitis; no open wounds;  Musculoskeletal: no muscle wasting or atrophy   Neurologic: A&O X 3; Appropriate Affect ; SENSATION: normal; MOTOR FUNCTION:  moving all extremities equally. Speech is fluent/normal  Non-Invasive Vascular Imaging CAROTID DUPLEX 04/12/2012  Right ICA 0 - 19% stenosis Left ICA 0 - 19% stenosis   ASSESSMENT/PLAN: Asymptomatic patient with stable carotid duplex compared one year ago. The patient will followup in one year with repeat carotid duplex. The patient's questions were encouraged and answered, he is in agreement with this plan.  Lauree Chandler ANP   Clinic MD: Edilia Bo

## 2012-04-13 NOTE — Addendum Note (Signed)
Addended by: Sharee Pimple on: 04/13/2012 08:11 AM   Modules accepted: Orders

## 2012-09-05 ENCOUNTER — Encounter (HOSPITAL_COMMUNITY): Payer: Self-pay | Admitting: Internal Medicine

## 2012-09-05 ENCOUNTER — Inpatient Hospital Stay (HOSPITAL_COMMUNITY): Payer: Medicare Other

## 2012-09-05 ENCOUNTER — Observation Stay (HOSPITAL_COMMUNITY)
Admission: AD | Admit: 2012-09-05 | Discharge: 2012-09-09 | Disposition: A | Discharge status: Skilled Nursing Facility | Payer: Medicare Other | Source: Other Acute Inpatient Hospital | Attending: Internal Medicine | Admitting: Internal Medicine

## 2012-09-05 DIAGNOSIS — I209 Angina pectoris, unspecified: Secondary | ICD-10-CM | POA: Insufficient documentation

## 2012-09-05 DIAGNOSIS — S82001A Unspecified fracture of right patella, initial encounter for closed fracture: Secondary | ICD-10-CM

## 2012-09-05 DIAGNOSIS — R079 Chest pain, unspecified: Secondary | ICD-10-CM | POA: Diagnosis present

## 2012-09-05 DIAGNOSIS — I2589 Other forms of chronic ischemic heart disease: Secondary | ICD-10-CM | POA: Insufficient documentation

## 2012-09-05 DIAGNOSIS — S82009A Unspecified fracture of unspecified patella, initial encounter for closed fracture: Principal | ICD-10-CM | POA: Insufficient documentation

## 2012-09-05 DIAGNOSIS — F3289 Other specified depressive episodes: Secondary | ICD-10-CM | POA: Insufficient documentation

## 2012-09-05 DIAGNOSIS — N19 Unspecified kidney failure: Secondary | ICD-10-CM | POA: Diagnosis present

## 2012-09-05 DIAGNOSIS — W19XXXA Unspecified fall, initial encounter: Secondary | ICD-10-CM | POA: Insufficient documentation

## 2012-09-05 DIAGNOSIS — N189 Chronic kidney disease, unspecified: Secondary | ICD-10-CM | POA: Insufficient documentation

## 2012-09-05 DIAGNOSIS — I129 Hypertensive chronic kidney disease with stage 1 through stage 4 chronic kidney disease, or unspecified chronic kidney disease: Secondary | ICD-10-CM | POA: Insufficient documentation

## 2012-09-05 DIAGNOSIS — Z79899 Other long term (current) drug therapy: Secondary | ICD-10-CM | POA: Insufficient documentation

## 2012-09-05 DIAGNOSIS — I25810 Atherosclerosis of coronary artery bypass graft(s) without angina pectoris: Secondary | ICD-10-CM | POA: Insufficient documentation

## 2012-09-05 DIAGNOSIS — Z01810 Encounter for preprocedural cardiovascular examination: Secondary | ICD-10-CM

## 2012-09-05 DIAGNOSIS — F329 Major depressive disorder, single episode, unspecified: Secondary | ICD-10-CM | POA: Insufficient documentation

## 2012-09-05 DIAGNOSIS — E119 Type 2 diabetes mellitus without complications: Secondary | ICD-10-CM | POA: Insufficient documentation

## 2012-09-05 DIAGNOSIS — I10 Essential (primary) hypertension: Secondary | ICD-10-CM

## 2012-09-05 DIAGNOSIS — I2510 Atherosclerotic heart disease of native coronary artery without angina pectoris: Secondary | ICD-10-CM

## 2012-09-05 DIAGNOSIS — Z8546 Personal history of malignant neoplasm of prostate: Secondary | ICD-10-CM | POA: Insufficient documentation

## 2012-09-05 HISTORY — DX: Occlusion and stenosis of unspecified carotid artery: I65.29

## 2012-09-05 LAB — COMPREHENSIVE METABOLIC PANEL
ALT: 9 U/L (ref 0–53)
Albumin: 3.6 g/dL (ref 3.5–5.2)
Alkaline Phosphatase: 93 U/L (ref 39–117)
BUN: 30 mg/dL — ABNORMAL HIGH (ref 6–23)
Chloride: 101 mEq/L (ref 96–112)
Potassium: 4.4 mEq/L (ref 3.5–5.1)
Total Bilirubin: 0.6 mg/dL (ref 0.3–1.2)

## 2012-09-05 LAB — CBC WITH DIFFERENTIAL/PLATELET
Basophils Absolute: 0 10*3/uL (ref 0.0–0.1)
Basophils Relative: 1 % (ref 0–1)
Eosinophils Absolute: 0.1 10*3/uL (ref 0.0–0.7)
Eosinophils Relative: 1 % (ref 0–5)
Lymphocytes Relative: 13 % (ref 12–46)
MCH: 30.4 pg (ref 26.0–34.0)
MCHC: 34.9 g/dL (ref 30.0–36.0)
MCV: 87.2 fL (ref 78.0–100.0)
Platelets: 135 10*3/uL — ABNORMAL LOW (ref 150–400)
RDW: 13.8 % (ref 11.5–15.5)
WBC: 6.1 10*3/uL (ref 4.0–10.5)

## 2012-09-05 LAB — GLUCOSE, CAPILLARY
Glucose-Capillary: 127 mg/dL — ABNORMAL HIGH (ref 70–99)
Glucose-Capillary: 147 mg/dL — ABNORMAL HIGH (ref 70–99)

## 2012-09-05 LAB — URINALYSIS, ROUTINE W REFLEX MICROSCOPIC
Bilirubin Urine: NEGATIVE
Glucose, UA: 250 mg/dL — AB
Ketones, ur: NEGATIVE mg/dL
Protein, ur: 100 mg/dL — AB

## 2012-09-05 LAB — TSH: TSH: 1.534 u[IU]/mL (ref 0.350–4.500)

## 2012-09-05 LAB — TYPE AND SCREEN: Antibody Screen: NEGATIVE

## 2012-09-05 LAB — URINE MICROSCOPIC-ADD ON

## 2012-09-05 LAB — TROPONIN I: Troponin I: <0.3 ng/mL (ref <0.30)

## 2012-09-05 MED ORDER — LISINOPRIL 20 MG PO TABS
20.0000 mg | ORAL_TABLET | Freq: Two times a day (BID) | ORAL | Status: DC
  Administered 2012-09-05 – 2012-09-09 (×9): 20 mg via ORAL
  Filled 2012-09-05 (×12): qty 1

## 2012-09-05 MED ORDER — AMITRIPTYLINE HCL 10 MG PO TABS
10.0000 mg | ORAL_TABLET | Freq: Every day | ORAL | Status: DC
  Administered 2012-09-05 – 2012-09-08 (×4): 10 mg via ORAL
  Filled 2012-09-05 (×5): qty 1

## 2012-09-05 MED ORDER — SODIUM CHLORIDE 0.9 % IV SOLN
INTRAVENOUS | Status: AC
  Administered 2012-09-05: 05:00:00 via INTRAVENOUS

## 2012-09-05 MED ORDER — NITROGLYCERIN 0.4 MG SL SUBL
0.4000 mg | SUBLINGUAL_TABLET | SUBLINGUAL | Status: DC | PRN
Start: 2012-09-05 — End: 2012-09-09
  Administered 2012-09-05 – 2012-09-06 (×7): 0.4 mg via SUBLINGUAL
  Filled 2012-09-05 (×4): qty 25

## 2012-09-05 MED ORDER — HYDROCODONE-ACETAMINOPHEN 5-325 MG PO TABS
1.0000 | ORAL_TABLET | Freq: Four times a day (QID) | ORAL | Status: DC | PRN
  Administered 2012-09-05 – 2012-09-06 (×3): 2 via ORAL
  Administered 2012-09-07: 1 via ORAL
  Administered 2012-09-09 (×2): 2 via ORAL
  Filled 2012-09-05 (×5): qty 2
  Filled 2012-09-05: qty 1
  Filled 2012-09-05 (×2): qty 2

## 2012-09-05 MED ORDER — LORAZEPAM 1 MG PO TABS
1.0000 mg | ORAL_TABLET | Freq: Every evening | ORAL | Status: DC | PRN
  Administered 2012-09-05 – 2012-09-08 (×2): 1 mg via ORAL
  Filled 2012-09-05 (×2): qty 1

## 2012-09-05 MED ORDER — ISOSORBIDE DINITRATE 30 MG PO TABS
60.0000 mg | ORAL_TABLET | Freq: Two times a day (BID) | ORAL | Status: DC
  Administered 2012-09-05 – 2012-09-09 (×10): 60 mg via ORAL
  Filled 2012-09-05 (×11): qty 2

## 2012-09-05 MED ORDER — INSULIN ASPART 100 UNIT/ML ~~LOC~~ SOLN: 0.0000 [IU] | Freq: Three times a day (TID) | SUBCUTANEOUS | Status: DC

## 2012-09-05 MED ORDER — HYDRALAZINE HCL 20 MG/ML IJ SOLN
10.0000 mg | INTRAMUSCULAR | Status: DC | PRN
  Administered 2012-09-05 – 2012-09-06 (×2): 10 mg via INTRAVENOUS
  Filled 2012-09-05 (×2): qty 1

## 2012-09-05 MED ORDER — MORPHINE SULFATE 2 MG/ML IJ SOLN
0.5000 mg | INTRAMUSCULAR | Status: DC | PRN
  Administered 2012-09-05 (×3): 0.5 mg via INTRAVENOUS
  Filled 2012-09-05 (×3): qty 1

## 2012-09-05 MED ORDER — ASPIRIN 81 MG PO CHEW
81.0000 mg | CHEWABLE_TABLET | Freq: Every day | ORAL | Status: DC
  Administered 2012-09-05 – 2012-09-09 (×5): 81 mg via ORAL
  Filled 2012-09-05 (×7): qty 1

## 2012-09-05 MED ORDER — AMLODIPINE BESYLATE 10 MG PO TABS
10.0000 mg | ORAL_TABLET | Freq: Every day | ORAL | Status: DC
  Administered 2012-09-05 – 2012-09-08 (×4): 10 mg via ORAL
  Filled 2012-09-05 (×5): qty 1

## 2012-09-05 MED ORDER — INSULIN ASPART 100 UNIT/ML ~~LOC~~ SOLN
0.0000 [IU] | SUBCUTANEOUS | Status: DC
  Administered 2012-09-05: 1 [IU] via SUBCUTANEOUS
  Administered 2012-09-06 (×2): 2 [IU] via SUBCUTANEOUS
  Administered 2012-09-06 (×2): 1 [IU] via SUBCUTANEOUS
  Administered 2012-09-06 – 2012-09-07 (×3): 2 [IU] via SUBCUTANEOUS
  Administered 2012-09-07 (×2): 1 [IU] via SUBCUTANEOUS
  Administered 2012-09-07 (×2): 2 [IU] via SUBCUTANEOUS
  Administered 2012-09-08 (×4): 1 [IU] via SUBCUTANEOUS
  Administered 2012-09-08: 2 [IU] via SUBCUTANEOUS
  Administered 2012-09-09: 1 [IU] via SUBCUTANEOUS

## 2012-09-05 MED ORDER — ISOSORBIDE DINITRATE 30 MG PO TABS: 30.0000 mg | ORAL_TABLET | Freq: Every day | ORAL | Status: DC

## 2012-09-05 MED ORDER — GLIPIZIDE 10 MG PO TABS
10.0000 mg | ORAL_TABLET | Freq: Two times a day (BID) | ORAL | Status: DC
  Administered 2012-09-05 – 2012-09-08 (×6): 10 mg via ORAL
  Filled 2012-09-05 (×11): qty 1

## 2012-09-05 MED ORDER — MORPHINE SULFATE 2 MG/ML IJ SOLN: 2.0000 mg | INTRAMUSCULAR | Status: DC | PRN

## 2012-09-05 NOTE — Consult Note (Signed)
Ethan Richmond, MD Chief Complaint: Right knee pain History: [name] is a 79 y.o. male history of CAD status post CABG and last cardiac catheter in 2010, diabetes mellitus type 2, hypertension, hyperlipidemia, prostate cancer had a fall while shopping. During the fall he also hit his head and had sustained a laceration on his left periorbital area. At that time he had refused to go to the ER and went back to his house where he started developing pain in his right knee with swelling. At that point patient went to the ER and was found to have right knee comminuted patellar fracture with knee effusion and subcutaneous edema. CT head and maxillofacial done showed left periorbital soft tissue swelling. CT head was negative for any acute fractures. On-call orthopedic surgeon Dr. Shon Baton was consulted by the ER physician and at this time patient has been transferred to [facility] for further management. After the fall patient also had chest pain which was retrosternal and lasted for a few minutes. Denies any associated shortness of breath sweating nausea vomiting abdominal pain or diarrhea. Chest the patient is chest pain-free. EKG done at [facility] showed normal sinus rhythm with RBBB.  Past Medical History  Diagnosis Date  . Diabetes mellitus   . Hypertension   . Hyperlipidemia   . Coronary artery disease   . Cancer of prostate   . Stroke 2000    Allergies  Allergen Reactions  . Lipitor (Atorvastatin) Other (See Comments)    GI  And  " pt could not talk"  . Reglan (Metoclopramide) Other (See Comments)    Trimmers " pt can't talk"  . Zocor (Simvastatin)     GI  . Codeine Nausea And Vomiting  . Metoclopramide Hcl     No current facility-administered medications on file prior to encounter.   Current Outpatient Prescriptions on File Prior to Encounter  Medication Sig Dispense Refill  . amitriptyline (ELAVIL) 10 MG tablet Take 10 mg by mouth at bedtime.      Marland Kitchen  amLODipine (NORVASC) 10 MG tablet Take 10 mg by mouth at bedtime.        Marland Kitchen aspirin (ASPIRIN EC) 81 MG EC tablet Take 81 mg by mouth daily.        . Aspirin-Salicylamide-Caffeine (BC HEADACHE POWDER PO) Take 845 mg by mouth as needed.      . clopidogrel (PLAVIX) 75 MG tablet Take 75 mg by mouth daily.        . furosemide (LASIX) 20 MG tablet Take 1 tablet (20 mg total) by mouth daily.  30 tablet  6  . glipiZIDE (GLUCOTROL) 10 MG tablet Take 10 mg by mouth 2 (two) times daily before a meal.        . isosorbide dinitrate (ISORDIL) 30 MG tablet Take 30 mg by mouth. Take 2 tabs bid       . lisinopril (PRINIVIL,ZESTRIL) 20 MG tablet Take 20 mg by mouth 2 (two) times daily.        Marland Kitchen LORazepam (ATIVAN) 1 MG tablet Take 1 mg by mouth at bedtime as needed.        . nitroGLYCERIN (NITROSTAT) 0.4 MG SL tablet Place 1 tablet (0.4 mg total) under the tongue every 5 (five) minutes as needed.  25 tablet  2    Physical Exam: Filed Vitals:   09/05/12 0500  BP: 183/77  Pulse: 77  Temp: 97.4 F (36.3 C)  Resp: 14  A+OX3 Left eye: swollen, eccymotic No sob/cp abd  soft/nt Right knee: pain with palpation.  No laceration/abrasion Compartments soft/nt EHL/TA/GA intact.  Intact sensation to LT    Image: No results found.  A/P:  Elderly gentlemen s/p fall with communited patellar fracture. Plan:  Continue knee immobilizer and ice  Difficulty viewing images from outside hospital - may need to re-order xrays  Will discuss management with Dr Carola Frost  Further recommendations pending.

## 2012-09-05 NOTE — Consult Note (Signed)
CARDIOLOGY CONSULT NOTE   Patient ID: [name] MRN: [id number] DOB/AGE: Dec 25, 1928 79 y.o.  Admit date: 09/05/2012 Primary Physician [name], MD Primary Cardiologist  Dr. Kirke Corin Chief Complaint  Preop  HPI:  The patient fell and has a right patellar fracture.  Surgery is being considered.  We are called as the patient has a history of CAD with CABG.  He did complain of some chest pain. He says however he has chronic chest discomfort. This happens typically with exertion but he doesn't require much such as getting up and walking across the room. He thinks some of it is heartburn. He takes nitroglycerin he reports about once a week. He cannot quantify or qualify this discomfort. He does not report associated symptoms although he occasionally gets nausea. He says the symptoms have been unchanged or perhaps better than previous. He's not describing any associated shortness of breath, PND or orthopnea. He has however a very low functional status being very sedentary.  His last cardiac evaluation was a cath in 2010 with occluded SVG to RCA, occluded SVG to diagonal and patent LIMA to the LAD.  A vein graft toanother diagonal was subtotally occluded with thrombus noted. The LAD had subtotal occlusion. Diagonal had 90% stenosis. AV circumflex subtotal stenosis. Right coronary artery occluded. He was managed with medical therapy.  His EF was 45%.  He was last seen in our clinic in 2012.  He has had a history of chronic stable exertional angina.     Past Medical History  Diagnosis Date  . Diabetes mellitus   . Hypertension   . Hyperlipidemia   . Coronary artery disease   . Cancer of prostate   . Stroke 2000    Past Surgical History  Procedure Laterality Date  . Carotid endarterectomy  02/01/2007  . Carotid endarterectomy  03/18/2010    right  . Coronary artery bypass graft  12/24/1996    x6  . Transurethral resection of prostate  2008  . Joint replacement      Left hip      Allergies  Allergen Reactions  . Lipitor (Atorvastatin) Other (See Comments)    GI  And  " pt could not talk"  . Reglan (Metoclopramide) Other (See Comments)    Trimmers " pt can't talk"  . Zocor (Simvastatin)     GI  . Codeine Nausea And Vomiting  . Baycol (Cerivastatin)   . Metoclopramide Hcl    Prescriptions prior to admission  Medication Sig Dispense Refill  . amitriptyline (ELAVIL) 10 MG tablet Take 10 mg by mouth at bedtime.      Marland Kitchen amLODipine (NORVASC) 10 MG tablet Take 10 mg by mouth at bedtime.        . Aspirin-Salicylamide-Caffeine (BC HEADACHE POWDER PO) Take 845 mg by mouth as needed.      . furosemide (LASIX) 20 MG tablet Take 20 mg by mouth.      Marland Kitchen glipiZIDE (GLUCOTROL) 10 MG tablet Take 10 mg by mouth 2 (two) times daily before a meal.        . indomethacin (INDOCIN) 25 MG capsule Take 25 mg by mouth daily.      . isosorbide dinitrate (ISORDIL) 30 MG tablet Take 30 mg by mouth. Take 2 tabs bid       . lisinopril (PRINIVIL,ZESTRIL) 20 MG tablet Take 20 mg by mouth 2 (two) times daily.        Marland Kitchen LORazepam (ATIVAN) 1 MG tablet Take 0.5-1 mg by mouth  every 6 (six) hours as needed for anxiety.      . nitroGLYCERIN (NITROSTAT) 0.4 MG SL tablet Place 1 tablet (0.4 mg total) under the tongue every 5 (five) minutes as needed.  25 tablet  2   Family History  Problem Relation Age of Onset  . Heart disease Father 44    Heart Disease before age 13  . Stroke Sister   . Diabetes Sister   . Heart disease Brother   . Diabetes Brother   . Heart disease Brother   . Diabetes Brother   . Heart disease Brother   . Diabetes Brother   . Heart disease Brother   . Diabetes Brother     History   Social History  . Marital Status: Married    Spouse Name: N/A    Number of Children: N/A  . Years of Education: N/A   Occupational History  . Not on file.   Social History Main Topics  . Smoking status: Never Smoker   . Smokeless tobacco: Never Used  . Alcohol Use: No  . Drug Use:  No  . Sexually Active: Not on file   Other Topics Concern  . Not on file   Social History Narrative  . No narrative on file     ROS:  Balance problems, hematuria.  As stated in the HPI and negative for all other systems.  Physical Exam: Blood pressure 183/77, pulse 77, temperature 97.4 F (36.3 C), resp. rate 14, SpO2 97.00%.  GENERAL:  Well appearing HEENT:  Pupils equal round and reactive, fundi not visualized, oral mucosa unremarkable NECK:  No jugular venous distention, waveform within normal limits, carotid upstroke brisk and symmetric, no bruits, no thyromegaly LYMPHATICS:  No cervical, inguinal adenopathy LUNGS:  Clear to auscultation bilaterally BACK:  No CVA tenderness CHEST: Well healed sternotomy scar. HEART:  PMI not displaced or sustained,S1 and S2 within normal limits, no S3, no S4, no clicks, no rubs, no murmurs ABD:  Flat, positive bowel sounds normal in frequency in pitch, no bruits, no rebound, no guarding, no midline pulsatile mass, no hepatomegaly, no splenomegaly EXT:  2 plus pulses upper and decreased pulses bilateral lower, no edema, no cyanosis no clubbing SKIN:  No rashes no nodules NEURO:  Cranial nerves II through XII grossly intact, motor grossly intact throughout PSYCH:  Cognitively intact, oriented to person place and time   Labs: Lab Results  Component Value Date   BUN 30* 09/05/2012   Lab Results  Component Value Date   CREATININE 1.40* 09/05/2012   Lab Results  Component Value Date   NA 139 09/05/2012   K 4.4 09/05/2012   CL 101 09/05/2012   CO2 27 09/05/2012   Lab Results  Component Value Date   TROPONINI <0.30 09/05/2012   Lab Results  Component Value Date   WBC 6.1 09/05/2012   HGB 14.5 09/05/2012   HCT 41.6 09/05/2012   MCV 87.2 09/05/2012   PLT 135* 09/05/2012    Lab Results  Component Value Date   ALT 9 09/05/2012   AST 13 09/05/2012   ALKPHOS 93 09/05/2012   BILITOT 0.6 09/05/2012   Radiology:  CXR:  Increasing vascular  congestion and bibasilar atelectasis. Low lung  volumes.  EKG: NSR, RBBB, anterior T wave inversion unchanged from previous.  09/05/2012   ASSESSMENT AND PLAN:   Preop:  The patient has extensive coronary disease as described. He has a low functional level. He would be at moderate risk at least for  cardiovascular complications with any planned surgery. I do not think that further risk stratify and with perfusion imaging would alter this. I have discussed this with the patient and his daughter. He certainly should be managed aggressively with current medications and follow closely on telemetry afterwards if he is to have surgery.  CAD:  As above.  HTN:  His blood pressure is somewhat labile. He currently will continue the meds as listed. With adjustments as needed.   SignedRollene Rotunda 09/05/2012, 11:14 AM

## 2012-09-05 NOTE — Progress Notes (Signed)
Inpatient Diabetes Program Recommendations  AACE/ADA: New Consensus Statement on Inpatient Glycemic Control (2013)  Target Ranges:  Prepandial:   less than 140 mg/dL      Peak postprandial:   less than 180 mg/dL (1-2 hours)      Critically ill patients:  140 - 180 mg/dL    Pt NPO with correction scale ordered tidwc. Please change to q 4 hrs while NPO  Thank you, Lenor Coffin, RN, CNS, Diabetes Coordinator [phone] you, Lenor Coffin, RN, CNS, Diabetes Coordinator [phone])

## 2012-09-05 NOTE — Progress Notes (Signed)
Pt refused foley catheter. Will continue monitor output

## 2012-09-05 NOTE — Progress Notes (Signed)
Nutrition Brief Note  MD consult received.   Pt and daughter report that pt has not had any recent weight changes. Pt lives with wife. Wife does cooking. Per pt and daughter pt's appetite is good.  Encouraged good intake once diet advanced.   Body mass index is 25.26 kg/(m^2). Patient meets criteria for overweight based on current BMI.   Current diet order is NPO for possible surgery. Labs and medications reviewed.   No nutrition interventions warranted at this time. If nutrition issues arise, please consult RD.   Kendell Bane RD, LDN, CNSC [phone] Pager [phone] After Hours Pager

## 2012-09-05 NOTE — Progress Notes (Signed)
Patient seen and examined, admitted by Dr. Toniann Fail this morning Briefly 79 year old male with history of CAD status post CABG, last cardiac cath in 2010, and diabetes mellitus, hypertension, hyperlipidemia presented after mechanical fall with right knee comminuted patellar fracture.  No current chest pain, syncopal episode, shortness of breath but had CP after fall.  EKG done at [facility] showed normal sinus rhythm with RBBB. Troponin less than 0.3 - Cardiology consultation called for preop clearance (his cardiologist Dr. Juanda Chance, [facility] cards) - Deferred to orthopedics the timing of surgery, Dr. Shon Baton has seen the patient and will discuss with Dr. Elnoria Howard M.D. [facility] 09/05/2012, 11:39 AM  Pager: [phone]

## 2012-09-05 NOTE — Progress Notes (Signed)
Orthopaedic Trauma Service  Full consult to follow  Dr. Shon Baton requested consultation from Dr. Carola Frost and OTS for R patella fx.  Films reviewed.  Will check CT scan.  Possibly could treat nonoperatively based on findings of CT. CT would also help with surgical planning if that is needed as well.    Plan for surgery on Thursday Will let pt eat   Mearl Latin, PA-C [facility] [phone] (P) 09/05/2012 12:58 PM

## 2012-09-05 NOTE — Consult Note (Signed)
Orthopaedic Trauma Service Consult   Requesting: D. Shon Baton, MD Reason: R patella fracture  HPI:    Condition is an 79 year old male with numerous medical comorbidities including CAD, type 2 diabetes, hypertension and history of prostate cancer who sustained a fall early yesterday morning while at a farm supply store. Patient was attempting to go to the bathroom at store when he sustained. We states that the patient should have his cane with him but didn't on and was undergone send him fell. The wife did not witness the fall but it was witnessed by an aide. The patient hit the left side of his face as well as his right knee. Initially patient refused to go to the emergency department but at the request of his daughter he was brought to [facility]. He was found to have a right patella fracture. Supposedly the patient was transferred to [place] because [facility] do not have the capabilities to operate on the patient's right patella fracture if need be. Patient was admitted to the hospital service given his medical comorbidities. Dr. [facility] to the patient from an orthopedic standpoint. Due to the complexity of the fracture he did seek consultation from Dr. handy and the or trauma service  Patient was seen today on 09/05/2012 in 5 N. room 1. Complains of right knee pain and left facial pain no other specific complaints. Denies any chest pain or shortness of breath. No additional injuries are noted. Patient was also seen by cardiology during this admission him given his complex cardiac history and he is at least a moderate risk for surgery.  Past Medical History  Diagnosis Date  . Diabetes mellitus   . Hypertension   . Hyperlipidemia   . Coronary artery disease   . Cancer of prostate   . Stroke 2000  . Carotid stenosis    Past Surgical History  Procedure Laterality Date  . Carotid endarterectomy  02/01/2007  . Carotid endarterectomy  03/18/2010    right  . Coronary artery bypass  graft  12/24/1996    x6  . Transurethral resection of prostate  2008  . Joint replacement      Left hip   Allergies  Allergen Reactions  . Lipitor (Atorvastatin) Other (See Comments)    GI  And  " pt could not talk"  . Reglan (Metoclopramide) Other (See Comments)    Trimmers " pt can't talk"  . Zocor (Simvastatin)     GI  . Codeine Nausea And Vomiting  . Baycol (Cerivastatin)   . Metoclopramide Hcl    Family History  Problem Relation Age of Onset  . Heart disease Father 37    Heart Disease before age 14  . Stroke Sister   . Diabetes Sister     x 2 with diabetes  . CAD Brother 22  . Diabetes Brother   . CAD Son 85   History   Social History  . Marital Status: Married    Spouse Name: N/A    Number of Children: 4  . Years of Education: N/A   Occupational History  .     Social History Main Topics  . Smoking status: Never Smoker   . Smokeless tobacco: Never Used  . Alcohol Use: No  . Drug Use: No  . Sexually Active: Not on file   Other Topics Concern  . Not on file   Social History Narrative   Three living children.  Lives with wife.     Medications Prior to Admission  Medication Sig Dispense Refill  . amitriptyline (ELAVIL) 10 MG tablet Take 10 mg by mouth at bedtime.      Marland Kitchen amLODipine (NORVASC) 10 MG tablet Take 10 mg by mouth at bedtime.        . Aspirin-Salicylamide-Caffeine (BC HEADACHE POWDER PO) Take 845 mg by mouth as needed.      . furosemide (LASIX) 20 MG tablet Take 20 mg by mouth.      Marland Kitchen glipiZIDE (GLUCOTROL) 10 MG tablet Take 10 mg by mouth 2 (two) times daily before a meal.        . indomethacin (INDOCIN) 25 MG capsule Take 25 mg by mouth daily.      . isosorbide dinitrate (ISORDIL) 30 MG tablet Take 30 mg by mouth. Take 2 tabs bid       . lisinopril (PRINIVIL,ZESTRIL) 20 MG tablet Take 20 mg by mouth 2 (two) times daily.        Marland Kitchen LORazepam (ATIVAN) 1 MG tablet Take 0.5-1 mg by mouth every 6 (six) hours as needed for anxiety.      .  nitroGLYCERIN (NITROSTAT) 0.4 MG SL tablet Place 1 tablet (0.4 mg total) under the tongue every 5 (five) minutes as needed.  25 tablet  2   ROS As above in the history of present illness  Physical Exam  Constitutional: He is cooperative. No distress.  HENT:  Significant swelling and bruising left periorbital region. Left  eyes swollen shut  Cardiovascular:  S1 and S2  Pulmonary/Chest:  Clear bilaterally  Abdominal:  Soft, nontender,+  Musculoskeletal:  Right lower extremity  Inspection  no acute deformities are noted to the right thigh, knee, lower leg or foot. Very mild effusion is appreciated to the right knee. The skin distally is notable for PVD type changes, bronzy color noted to the skin around the ankle and foot  Bone  Patient is nontender to palpation of the right hip, femur, tibia, ankle, foot. He does have tenderness to palpation of his left patella.  Soft tissue/ligamentous exam  No significant findings noted to the soft tissue around the right lower leg. Ankle is stable evaluation. Knee is grossly stable as well. The hip is stable with evaluation. No pain with axial loading of the hip.  ROM: A full range of motion is noted at the ankle and foot. I did not perform range of motion of the right knee. Patient can not perform a straight leg raise.  Sensation: Deep peroneal nerve, superficial peroneal nerve and tibial nerve sensory functions are intact. Femoral nerve sensory functions intact as well  Motor:    EHL, FHL, anterior tibialis, posterior tibialis, peroneals and gastrocsoleus complex motor function are intact. The patient is able to perform a quad set but weak   Vascular:    A palpable dorsalis pedis pulses noted    Neurological: He is alert.    Imaging   xrays R knee   commintued R patella fracture. Primary fx line appears to be vertical but there does also appear to be a transverse fx line about the distal pole  Assessment and Plan  79 year old  male with complex medical history status post fall and right patella fracture  1. Fall 2. Right patella fracture  will check CT scan to fully evaluate the fracture lines.  Possible that we can manage this nonoperatively.  We can have a further discussion with the family about this given the patient's moderate cardiovascular risk.  Patient scheduled for CT scan this afternoon  If we plan to proceed with surgery we will plan on Thursday afternoon  We will have physical therapy work with patient tomorrow  3. medical issues   Per cardiology and internal medicine  4. FEN  Carb modified/cardiac diet  5. Pain  Continue with Norco and morphine as needed for pain control  6. DVT and PE prophylaxis  Patient on daily low-dose aspirin  SCDs have been ordered  7. Disposition  CT scan  Possible or Thursday   Mearl Latin, PA-C [facility] [phone] (P) 09/05/2012 5:06 PM

## 2012-09-05 NOTE — H&P (Addendum)
[facility] History and Physical  DIMA FERRUFINO ZOX:[id number] DOB: 1928/05/29 DOA: 09/05/2012  Referring physician: Patient was transferred from [facility]. PCP: [name], MD   Chief Complaint: Fall with right knee pain.  HPI: [name] is a 79 y.o. male history of CAD status post CABG and last cardiac catheter in 2010, diabetes mellitus type 2, hypertension, hyperlipidemia, prostate cancer had a fall while shopping. During the fall he also hit his head and had sustained a laceration on his left periorbital area. At that time he had refused to go to the ER and went back to his house where he started developing pain in his right knee with swelling. At that point patient went to the ER and was found to have right knee comminuted patellar fracture with knee effusion and subcutaneous edema. CT head and maxillofacial done showed left periorbital soft tissue swelling. CT head was negative for any acute fractures. On-call orthopedic surgeon Dr. Shon Baton was consulted by the ER physician and at this time patient has been transferred to [facility] for further management. After the fall patient also had chest pain which was retrosternal and lasted for a few minutes. Denies any associated shortness of breath sweating nausea vomiting abdominal pain or diarrhea. Chest the patient is chest pain-free. EKG done at [facility] showed normal sinus rhythm with RBBB.  Labs done over there showed WBC of 7.7 hemoglobin of 15.8 platelets 158 sodium 139 creatinine 1.7 potassium 4.3. Troponin was negative.  Review of Systems: As presented in the history of presenting illness, rest negative.  Past Medical History  Diagnosis Date  . Diabetes mellitus   . Hypertension   . Hyperlipidemia   . Coronary artery disease   . Cancer of prostate   . Stroke 2000   Past Surgical History  Procedure Laterality Date  . Carotid endarterectomy  02/01/2007  . Carotid endarterectomy   03/18/2010    right  . Coronary artery bypass graft  12/24/1996    x6  . Transurethral resection of prostate  2008  . Joint replacement      Left hip   Social History:  reports that he has never smoked. He has never used smokeless tobacco. He reports that he does not drink alcohol or use illicit drugs. Home. where does patient live-- Can do ADLs. Can patient participate in ADLs?  Allergies  Allergen Reactions  . Lipitor (Atorvastatin) Other (See Comments)    GI  And  " pt could not talk"  . Reglan (Metoclopramide) Other (See Comments)    Trimmers " pt can't talk"  . Zocor (Simvastatin)     GI  . Codeine Nausea And Vomiting  . Metoclopramide Hcl     Family History  Problem Relation Age of Onset  . Heart disease Father 58    Heart Disease before age 97  . Stroke Sister   . Diabetes Sister   . Heart disease Brother   . Diabetes Brother   . Heart disease Brother   . Diabetes Brother   . Heart disease Brother   . Diabetes Brother   . Heart disease Brother   . Diabetes Brother       Prior to Admission medications   Medication Sig Start Date End Date Taking? Authorizing Provider  amitriptyline (ELAVIL) 10 MG tablet Take 10 mg by mouth at bedtime.    Historical Provider, MD  amLODipine (NORVASC) 10 MG tablet Take 10 mg by mouth at bedtime.  Historical Provider, MD  aspirin (ASPIRIN EC) 81 MG EC tablet Take 81 mg by mouth daily.      Historical Provider, MD  Aspirin-Salicylamide-Caffeine (BC HEADACHE POWDER PO) Take 845 mg by mouth as needed.    Historical Provider, MD  clopidogrel (PLAVIX) 75 MG tablet Take 75 mg by mouth daily.      Historical Provider, MD  furosemide (LASIX) 20 MG tablet Take 1 tablet (20 mg total) by mouth daily. 05/19/10 05/19/11  Iran Ouch, MD  glipiZIDE (GLUCOTROL) 10 MG tablet Take 10 mg by mouth 2 (two) times daily before a meal.      Historical Provider, MD  isosorbide dinitrate (ISORDIL) 30 MG tablet Take 30 mg by mouth. Take 2 tabs bid      Historical Provider, MD  lisinopril (PRINIVIL,ZESTRIL) 20 MG tablet Take 20 mg by mouth 2 (two) times daily.      Historical Provider, MD  LORazepam (ATIVAN) 1 MG tablet Take 1 mg by mouth at bedtime as needed.      Historical Provider, MD  nitroGLYCERIN (NITROSTAT) 0.4 MG SL tablet Place 1 tablet (0.4 mg total) under the tongue every 5 (five) minutes as needed. 11/05/10   Iran Ouch, MD   Physical Exam: Filed Vitals:   09/05/12 0223 09/05/12 0256 09/05/12 0500  BP: 202/79 136/65 183/77  Pulse: 80 82 77  Temp:   97.4 F (36.3 C)  Resp: 16  14  SpO2: 97%  97%     General:  Well-developed and nourished.  Eyes: Left periorbital hematoma with laceration sutured.  ENT: Left periorbital hematoma with laceration sutured.  Neck: No mass felt.  Cardiovascular: S1-S2 heard.  Respiratory: No rhonchi or crepitations.  Abdomen: Soft nontender bowel sounds present.  Skin: Laceration on the left periorbital area.  Musculoskeletal: Right knee is in brace.  Psychiatric: Appears normal.  Neurologic: Alert awake oriented to time place and person. Moves all extremities.  Labs on Admission:  Basic Metabolic Panel: No results found for this basename: NA, K, CL, CO2, GLUCOSE, BUN, CREATININE, CALCIUM, MG, PHOS,  in the last 168 hours Liver Function Tests: No results found for this basename: AST, ALT, ALKPHOS, BILITOT, PROT, ALBUMIN,  in the last 168 hours No results found for this basename: LIPASE, AMYLASE,  in the last 168 hours No results found for this basename: AMMONIA,  in the last 168 hours CBC: No results found for this basename: WBC, NEUTROABS, HGB, HCT, MCV, PLT,  in the last 168 hours Cardiac Enzymes: No results found for this basename: CKTOTAL, CKMB, CKMBINDEX, TROPONINI,  in the last 168 hours  BNP (last 3 results) No results found for this basename: PROBNP,  in the last 8760 hours CBG:  Recent Labs Lab 09/05/12 0231  GLUCAP 186*    Radiological Exams on  Admission: No results found.  EKG: Independently reviewed. EKG done at [facility] showed normal sinus rhythm with RBBB.  Assessment/Plan Principal Problem:   Patellar fracture Active Problems:   Chest pain   CAD (coronary artery disease) of artery bypass graft   HTN (hypertension)   Renal failure   Diabetes mellitus   1. Comminuted patellar fracture with knee effusion and subcutaneous hematoma of right leg status post mechanical fall - at this time patient has been kept n.p.o. Orthopedic surgeon on call Dr. Shon Baton aware. If surgery is planned then we will need to get cardiology clearance given patient's chest pain and cardiac history. 2. Chest pain with history of CAD status post  CABG - presently chest pain-free. Cycle cardiac markers. Consult cardiology for clearance.Will hold plavix but continue aspirin in anticipation of possible surgery. 3. Left periorbital laceration and hematoma status post mechanical fall - has been sutured at the ER in [facility]. 4. Probable chronic kidney disease - closely follow intake output and metabolic panel. 5. History of ischemic cardiomyopathy - presently denies any shortness of breath. 6. Diabetes mellitus type 2 - closely follow CBGs with sliding-scale coverage. 7. Hypertension - continue present medications. 8. History of carotid endarterectomy. 9. History of prostate cancer.  Patient's labs including EKG, chest x-ray, metabolic panel, CBC, troponin, UA and urine cultures are pending.    Code Status: Full code.  Family Communication: Patient's daughter at the bedside.  Disposition Plan: Admit to inpatient.    KAKRAKANDY,ARSHAD N. [facility] Pager [phone].  If 7PM-7AM, please contact night-coverage [email or web address] Password [facility] 09/05/2012, 5:30 AM

## 2012-09-05 NOTE — Progress Notes (Signed)
Orthopedic Tech Progress Note Patient Details:  [name] 15-Feb-1929 [id number]  Patient ID: [name], male   DOB: [birth year], 79 y.o.   MRN: [id number]   [name] 09/05/2012, 4:46 PMTrapeze bar.

## 2012-09-06 LAB — GLUCOSE, CAPILLARY
Glucose-Capillary: 155 mg/dL — ABNORMAL HIGH (ref 70–99)
Glucose-Capillary: 156 mg/dL — ABNORMAL HIGH (ref 70–99)
Glucose-Capillary: 160 mg/dL — ABNORMAL HIGH (ref 70–99)

## 2012-09-06 MED ORDER — ONDANSETRON HCL 4 MG/2ML IJ SOLN
4.0000 mg | Freq: Four times a day (QID) | INTRAMUSCULAR | Status: DC | PRN
  Administered 2012-09-06: 4 mg via INTRAVENOUS
  Filled 2012-09-06: qty 2

## 2012-09-06 MED ORDER — LORAZEPAM 1 MG PO TABS
1.0000 mg | ORAL_TABLET | Freq: Four times a day (QID) | ORAL | Status: DC | PRN
  Administered 2012-09-06: 1 mg via ORAL
  Filled 2012-09-06 (×2): qty 1

## 2012-09-06 NOTE — Progress Notes (Signed)
Orthopedic Tech Progress Note Patient Details:  [name] 1928-08-17 [id number]  Patient ID: [name], male   DOB: 1928/04/03, 79 y.o.   MRN: [id number]   [name] 09/06/2012, 8:50 [facility] advanced for right hinged knee brace.

## 2012-09-06 NOTE — Progress Notes (Signed)
Orthopaedic Trauma Service  Subjective: Doing ok this am R knee with minimal pain  No new issues this am    Objective: Vital signs in last 24 hours: Temp:  [98.3 F (36.8 C)-101.3 F (38.5 C)] 98.3 F (36.8 C) (07/23 0853) Pulse Rate:  [69-88] 88 (07/23 0538) Resp:  [18] 18 (07/23 0538) BP: (123-214)/(60-85) 123/62 mmHg (07/23 0538) SpO2:  [93 %-96 %] 96 % (07/23 0538)  Intake/Output from previous day: 07/22 0701 - 07/23 0700 In: 240 [P.O.:240] Out: 275 [Urine:275] Intake/Output this shift:     Recent Labs  09/05/12 0845  HGB 14.5    Recent Labs  09/05/12 0845  WBC 6.1  RBC 4.77  HCT 41.6  PLT 135*    Recent Labs  09/05/12 0845  NA 139  K 4.4  CL 101  CO2 27  BUN 30*  CREATININE 1.40*  GLUCOSE 207*  CALCIUM 9.2    Recent Labs  09/05/12 0845  INR 1.09    Phyical Exam  Gen: awake, appears comfortable Ext:  Right Lower Extremity    Mild effusion R knee   No significant swelling distally    Unable to perform SLR   Distal motor and sensory functions intact   + DP pulse     Assessment/Plan:  79 year old male with complex medical history status post fall and right patella fracture  1. Fall 2. Right patella fracture             CT of R patella reviewed. Shows multiple fx lines including transverse fx distally  fx is relatively nondisplaced as well    After review of pts medical chart (including note from 08/22/2009) and hx he does have significant risk of perioperative complications given his cardiac dz w/ hx of CABG and MI as well as carotid disease w/ hx of stroke    Given current alignment we can definitely attempt non-operative tx, however it will require strict adherence to ROM restrictions and brace use.  It is also possible to that pt could fail non-op tx, in which case pt would need surgery to fix his extensor mechanism    i have discussed this with pt and he would like to try non-op management.  I will also contact family to  ensure that he can follow non-op tx plan    Non-op tx    WBAT in full extension in brace x 6-8 weeks   No R knee ROM x 2 weeks    Then increase knee flexion by 30 degrees each week starting in week 2    Pt would be restricted from active knee extension and extension agains resistance x 6-8 weeks to prevent tension stress on patella. Passive extension permitted after week 2      (these restrictions would the same for surgery as well)     Hinged knee brace ordered and will be applied, locked in full extension   PT to work with pt later today   3. medical issues              Per cardiology and internal medicine  4. FEN             Carb modified/cardiac diet  5. Pain             Continue with Norco and morphine as needed for pain control  6. DVT and PE prophylaxis             Patient on daily low-dose aspirin  SCDs have been ordered  7. Disposition             discuss with family    Mearl Latin, PA-C [facility] [phone] (P) 09/06/2012, 9:30 AM

## 2012-09-06 NOTE — Progress Notes (Signed)
[facility] PROGRESS NOTE  [name] WUJ:[id number] DOB: [birth year] DOA: 09/05/2012 PCP: [name], MD  Assessment/Plan: Comminuted patellar fracture with knee effusion and subcutaneous hematoma of right leg status post mechanical fall - Will defer to ortho. Possible non-operative mgt  Chest pain with history of CAD status post CABG - Seen by cardiology. Cleared for surgery if needed  Left periorbital laceration and hematoma status post mechanical fall - has been sutured at the ER in [facility].   Probable chronic kidney disease - closely follow intake output and metabolic panel.   History of ischemic cardiomyopathy - stable.   Diabetes mellitus type 2 - CBS have been stable thus far   Hypertension - continue present medications. Elevated this AM.  History of carotid endarterectomy.   History of prostate cancer.  Fever: Will check core temp to confirm. If pos, consider blood, urine cx with cxr   Code Status: Full Family Communication: Pt in room (indicate person spoken with, relationship, and if by phone, the number) Disposition Plan: Pending   Consultants:  Cardiology  Orthopedic surgery  HPI/Subjective: No acute events noted overnight  Objective: Filed Vitals:   09/05/12 2059 09/06/12 0000 09/06/12 0400 09/06/12 0538  BP: 214/85   123/62  Pulse: 86   88  Temp: 98.9 F (37.2 C)   101.3 F (38.5 C)  TempSrc: Oral   Oral  Resp:  18 18 18   SpO2: 93% 95% 96% 96%    Intake/Output Summary (Last 24 hours) at 09/06/12 0806 Last data filed at 09/05/12 2100  Gross per 24 hour  Intake    240 ml  Output    225 ml  Net     15 ml   There were no vitals filed for this visit.  Exam:   General:  Asleep, in nad, appears stated age  Cardiovascular: regular, s1, s2  Respiratory: normal resp effort, no wheezing  Abdomen: soft, nondistended, pos bs  Musculoskeletal: perfused,no clubbing   Data Reviewed: Basic Metabolic Panel:  Recent  Labs Lab 09/05/12 0845  NA 139  K 4.4  CL 101  CO2 27  GLUCOSE 207*  BUN 30*  CREATININE 1.40*  CALCIUM 9.2   Liver Function Tests:  Recent Labs Lab 09/05/12 0845  AST 13  ALT 9  ALKPHOS 93  BILITOT 0.6  PROT 6.9  ALBUMIN 3.6   No results found for this basename: LIPASE, AMYLASE,  in the last 168 hours No results found for this basename: AMMONIA,  in the last 168 hours CBC:  Recent Labs Lab 09/05/12 0845  WBC 6.1  NEUTROABS 4.3  HGB 14.5  HCT 41.6  MCV 87.2  PLT 135*   Cardiac Enzymes:  Recent Labs Lab 09/05/12 0843 09/05/12 1532  TROPONINI <0.30 <0.30   BNP (last 3 results) No results found for this basename: PROBNP,  in the last 8760 hours CBG:  Recent Labs Lab 09/05/12 1150 09/05/12 1632 09/05/12 2056 09/06/12 0014 09/06/12 0441  GLUCAP 181* 127* 147* 160* 156*    No results found for this or any previous visit (from the past 240 hour(s)).   Studies: Ct Knee Right Wo Contrast  09/05/2012   *RADIOLOGY REPORT*  Clinical Data: Patella fracture  CT OF THE RIGHT KNEE WITHOUT CONTRAST  Technique:  Multidetector CT imaging was performed according to the standard protocol. Multiplanar CT image reconstructions were also generated.  Comparison: 09/05/2012 0700 hours  Findings: A comminuted nondisplaced fracture within the patella extending into the patellofemoral joint is noted.  The main component of the fracture is primarily sagittal splaying the patella into right and left fragments.  There is also a horizontal component within the inferior aspect of the medial fragment.  Small fragment that is versus superiorly are noted.  Joint hemarthrosis is present.  The patellar tendon and quadriceps tendon are grossly intact. Medial and lateral retinacula are grossly intact. PCL is grossly intact. The ACL is obscured.  Advanced degenerative changes in all three compartments are superimposed.  The femur, tibia, and fibula are intact.  IMPRESSION: Comminuted  intra-articular patella fracture.   Original Report Authenticated By: Jolaine Click, M.D.   Dg Chest Portable 1 View  09/05/2012   *RADIOLOGY REPORT*  Clinical Data: Preop right knee replacement.  PORTABLE CHEST - 1 VIEW  Comparison: 09/04/2012  Findings: Prior CABG.  Low lung volumes with vascular congestion and bibasilar atelectasis.  Heart size is borderline, accentuated by the low volumes.  No visible significant effusion or acute bony abnormality.  IMPRESSION: Increasing vascular congestion and bibasilar atelectasis.  Low lung volumes.   Original Report Authenticated By: Charlett Nose, M.D.   Dg Knee Right Port  09/05/2012   *RADIOLOGY REPORT*  Clinical Data: Preoperative examination (patellar fracture)  PORTABLE RIGHT KNEE - 1-2 VIEW  Comparison: 09/04/2012  Findings:  Grossly unchanged appearance and alignment of previously identified comminuted fracture of the patella with extension to involve the patellofemoral joint.  Small joint effusion.  No definite evidence of lipohemarthrosis.  No additional displaced fractures identified.  Moderate tricompartmental degenerative change, likely worse within the medial compartment with joint space loss, subchondral sclerosis and osteophytosis.  There is spurring and irregularity of the tibial spines.  Vascular calcifications.  No radiopaque foreign body.  IMPRESSION: Unchanged appearance of alignment of comminuted intra-articular patellar fracture.   Original Report Authenticated By: Tacey Ruiz, MD    Scheduled Meds: . amitriptyline  10 mg Oral QHS  . amLODipine  10 mg Oral QHS  . aspirin  81 mg Oral Daily  . glipiZIDE  10 mg Oral BID AC  . insulin aspart  0-9 Units Subcutaneous Q4H  . isosorbide dinitrate  60 mg Oral BID  . lisinopril  20 mg Oral BID   Continuous Infusions:   Principal Problem:   Patellar fracture Active Problems:   Chest pain   CAD (coronary artery disease) of artery bypass graft   HTN (hypertension)   Renal failure   Diabetes  mellitus    Time spent:    Bryna Razavi K  [facility] Pager [phone]. If 7PM-7AM, please contact night-coverage at [email or web address], password [facility] 09/06/2012, 8:06 AM  LOS: 1 day

## 2012-09-06 NOTE — Progress Notes (Signed)
Pt has been complaining of chest pain on and off today.  Pt says he normally takes nitro tablets to relieve pain.  Family states it is not unusual for pt to take multiple nitro tablets in a day.  He has taken 6 doses of nitro today, due to chest pain.  His vitals have been stable bp 150's/60's, heart rate 80-90's.  Dr. Rhona Leavens notified, and orders added for anxiety for pt., nsg to continue to monitor.

## 2012-09-06 NOTE — Evaluation (Signed)
Physical Therapy Evaluation Patient Details Name: [name] MRN: [id number] DOB: [birth year] Today's Date: 09/06/2012 Time: 4098-1191 PT Time Calculation (min): 22 min  PT Assessment / Plan / Recommendation History of Present Illness  s/p fall resulting in R patella fx  Clinical Impression  Pt adm to [facility] secondary to fall resulting in R patella fx. Pt being treated conservatively due to extensive PMH and high risk for surgery. Pt presents with mobility deficits; requiring 2+ (A) for transfers at this time. States his wife is not in good health condition to help (A) him at home. Will recommend STSNF upon acute D/C. Pt highly motivated to return home with family. Pt will benefit from skilled PT while in acute setting to address problem list as listed below.     PT Assessment  Patient needs continued PT services    Follow Up Recommendations  SNF    Does the patient have the potential to tolerate intense rehabilitation      Barriers to Discharge Decreased caregiver support pt reports wife is in no condition to help him this much    Equipment Recommendations  Other (comment) (TBD)    Recommendations for Other Services     Frequency Min 4X/week    Precautions / Restrictions Precautions Precautions: Fall Precaution Comments: states "ive fallen a lot recently but never hurt anything"  Required Braces or Orthoses: Other Brace/Splint Other Brace/Splint: hinged brace R LE  Restrictions Weight Bearing Restrictions: Yes RLE Weight Bearing: Non weight bearing   Pertinent Vitals/Pain "no pain when i am sitting here. But 10/10 when we move"      Mobility  Bed Mobility Bed Mobility: Supine to Sit;Sitting - Scoot to Edge of Bed Supine to Sit: 3: Mod assist;HOB elevated;With rails Sitting - Scoot to Edge of Bed: 3: Mod assist Details for Bed Mobility Assistance: pt with difficulty maneuvering in bed; required (A) to advance R LE to/off bed; mod-max cues for hand placement and  sequencing  Transfers Transfers: Sit to Stand;Stand to Sit;Stand Pivot Transfers Sit to Stand: 1: +2 Total assist;From bed;From elevated surface Sit to Stand: Patient Percentage: 10% Stand to Sit: 1: +2 Total assist;To chair/3-in-1;With armrests;With upper extremity assist Stand to Sit: Patient Percentage: 10% Stand Pivot Transfers: 1: +2 Total assist;From elevated surface Stand Pivot Transfers: Patient Percentage: 10% Details for Transfer Assistance: mod-max cues for hand placement and sequencing; pt required 2+ (A) to maintain balance and NWB status on R LE; pt with difficulty pivoting to Lt; pt very unsteady and demo tremor with activity Ambulation/Gait Ambulation/Gait Assistance: Not tested (comment) Stairs: No Wheelchair Mobility Wheelchair Mobility: No    Exercises     PT Diagnosis: Difficulty walking;Acute pain  PT Problem List: Decreased strength;Decreased range of motion;Decreased activity tolerance;Decreased balance;Decreased mobility;Decreased knowledge of use of DME;Pain PT Treatment Interventions: DME instruction;Gait training;Functional mobility training;Therapeutic activities;Therapeutic exercise;Balance training;Neuromuscular re-education;Patient/family education     PT Goals(Current goals can be found in the care plan section) Acute Rehab PT Goals Patient Stated Goal: to not have to do rehab PT Goal Formulation: With patient Time For Goal Achievement: 09/13/12 Potential to Achieve Goals: Fair  Visit Information  Last PT Received On: 09/06/12 Assistance Needed: +2 History of Present Illness: s/p fall resulting in R patella fx       Prior Functioning  Home Living Family/patient expects to be discharged to:: Private residence Living Arrangements: Spouse/significant other Available Help at Discharge: Family;Available 24 hours/day Type of Home: House Home Access: Stairs to enter [employer] of Steps:  2 Entrance Stairs-Rails: Can reach both Home  Layout: Able to live on main level with bedroom/bathroom;Two level;Full bath on main level Home Equipment: Shower seat - built in;Walker - 2 wheels;Cane - single point Additional Comments: pt believes a wheelchair would fit through doorways in house Prior Function Level of Independence: Independent with assistive device(s) (uses SPC) Communication Communication: No difficulties Dominant Hand: Right    Cognition  Cognition Arousal/Alertness: Awake/alert Behavior During Therapy: WFL for tasks assessed/performed Overall Cognitive Status: Within Functional Limits for tasks assessed    Extremity/Trunk Assessment Upper Extremity Assessment Upper Extremity Assessment: Overall WFL for tasks assessed Lower Extremity Assessment Lower Extremity Assessment: RLE deficits/detail RLE: Unable to fully assess due to pain;Unable to fully assess due to immobilization Cervical / Trunk Assessment Cervical / Trunk Assessment: Normal   Balance Balance Balance Assessed: Yes Static Sitting Balance Static Sitting - Balance Support: Bilateral upper extremity supported;Feet supported Static Sitting - Level of Assistance: 4: Min assist;5: Stand by assistance Static Sitting - Comment/# of Minutes: pt wit difficulty maintaining upright posture initially; requiring (A) to sit on EOB; pt progressed to stand by (A) with constant vc's  End of Session PT - End of Session Equipment Utilized During Treatment: Gait belt;Other (comment) (R LE hinged brace) Activity Tolerance: Patient limited by fatigue;Patient limited by pain Patient left: in chair;with call bell/phone within reach;with nursing/sitter in room Nurse Communication: Mobility status  GP Functional Assessment Tool Used: clinical judgement Functional Limitation: Mobility: Walking and moving around Mobility: Walking and Moving Around Current Status ([id number]): At least 80 percent but less than 100 percent impaired, limited or restricted Mobility: Walking and  Moving Around Goal Status [phone]): At least 1 percent but less than 20 percent impaired, limited or restricted   Donell Sievert, [place] [phone] 09/06/2012, 1:14 PM

## 2012-09-07 DIAGNOSIS — R079 Chest pain, unspecified: Secondary | ICD-10-CM

## 2012-09-07 LAB — GLUCOSE, CAPILLARY
Glucose-Capillary: 129 mg/dL — ABNORMAL HIGH (ref 70–99)
Glucose-Capillary: 143 mg/dL — ABNORMAL HIGH (ref 70–99)
Glucose-Capillary: 163 mg/dL — ABNORMAL HIGH (ref 70–99)
Glucose-Capillary: 184 mg/dL — ABNORMAL HIGH (ref 70–99)

## 2012-09-07 LAB — URINE CULTURE: Colony Count: >=100000

## 2012-09-07 NOTE — Progress Notes (Signed)
Orthopedic Tech Progress Note Patient Details:  [name] 1928/10/14 [id number] Brace completed by Advanced vendor Patient ID: [name], male   DOB: 05/02/1928, 79 y.o.   MRN: [id number]   [name] 09/07/2012, 3:51 PM

## 2012-09-07 NOTE — Progress Notes (Signed)
Clinical Social Work Department  BRIEF PSYCHOSOCIAL ASSESSMENT  Patient: AH BOTT Account Number: [id number]  Admit date:  09/05/12 Clinical Social Worker Sabino Niemann, MSW Date/Time: 09/07/2012 12:00 PM Referred by: Physician Date Referred: 09/06/12 Referred for   SNF Placement   Other Referral:  Interview type: Patient and patient's daughter  Other interview type: PSYCHOSOCIAL DATA  Living Status: Patient lives with his spouse Admitted from facility:  Level of care:  Primary support name: Lowella Dell Primary support relationship to patient: Daughter Degree of support available:  Strong and vested  CURRENT CONCERNS  Current Concerns   Post-Acute Placement   Other Concerns:  SOCIAL WORK ASSESSMENT / PLAN  CSW met with pt- patient would not speak with SW but agreed for SW to speak with his daughter re: PT recommendation for SNF.   Pt lives with His spouse Naser Schuld  CSW explained placement process and answered questions.   Pt;s daughter reports Doctor, hospital  as her preference    CSW completed FL2 and initiated SNF search.     Assessment/plan status: Information/Referral to [facility]  Other assessment/ plan:  Information/referral to community resources:  SNF     PATIENT'S/FAMILY'S RESPONSE TO PLAN OF CARE:  Pt's daughter  Reports the patient is agreeable to ST SNF in order to increase strength and independence with mobility prior to returning home  Pt's daughter verbalized understanding of placement process and appreciation for CSW assist.   Sabino Niemann, MSW [phone]

## 2012-09-07 NOTE — Progress Notes (Signed)
We discussed with the patient the risks of surgery and the acceptability of current reduction.  We will pursue nonsurgical management as noted above.  Myrene Galas, MD [facility] [phone] (p)

## 2012-09-07 NOTE — Progress Notes (Signed)
Physical Therapy Treatment Patient Details Name: [name] MRN: [id number] DOB: 06-13-1928 Today's Date: 09/07/2012 Time: 8295-6213 PT Time Calculation (min): 15 min  PT Assessment / Plan / Recommendation  History of Present Illness s/p fall resulting in R patella fx   Clinical Impression Pt able to increase amb today with new WBAT status. Pt in high spirits at the end of the session. Reported that he "wanted to be with jesus this morning, but now he is more positive and in a better place". Pt cont to present with  Balance and mobility deficits. Will benefit from STSNF upon acute D/C to reduce burden of care and increase independence. Will cont to f/u with pt to maximize functional mobility.     PT Comments   Pt cont to benefit from Skilled PT; hopes to D/C tomorrow for SNF.  Follow Up Recommendations  SNF     Does the patient have the potential to tolerate intense rehabilitation     Barriers to Discharge        Equipment Recommendations  Other (comment) (TBD at rehab)    Recommendations for Other Services    Frequency Min 3X/week   Progress towards PT Goals Progress towards PT goals: Progressing toward goals  Plan Frequency needs to be updated    Precautions / Restrictions Precautions Precautions: Fall Required Braces or Orthoses: Other Brace/Splint Other Brace/Splint: hinged brace R LE; locked into extension Restrictions Weight Bearing Restrictions: Yes RLE Weight Bearing: Weight bearing as tolerated (With hinged brace)   Pertinent Vitals/Pain ".5/10"    Mobility  Bed Mobility Bed Mobility: Supine to Sit Supine to Sit: 3: Mod assist;HOB elevated;With rails Details for Bed Mobility Assistance: requires increased time; (A) to advance R LE off EOB and to bring truck to upright position on EOB; vc's for hand placement and sequencing, Transfers Transfers: Sit to Stand;Stand to Sit Sit to Stand: 1: +2 Total assist;From bed;From elevated surface;With upper extremity  assist Sit to Stand: Patient Percentage: 60% Stand to Sit: 1: +2 Total assist;To chair/3-in-1;With armrests;With upper extremity assist Stand to Sit: Patient Percentage: 60% Details for Transfer Assistance: pt able to increase participation in transfers today due to change in WB status; pt continues to demo decreased safety awareness and requires max cues for hand placement and safety with RW; verbal cues to maintain upright position in standing; pt has fwd flex head posture Ambulation/Gait Ambulation/Gait Assistance: 1: +2 Total assist Ambulation/Gait: Patient Percentage: 50% Ambulation Distance (Feet): 3 Feet Assistive device: Rolling walker Ambulation/Gait Assistance Details: max cues for safety and RW safety; pt required 2+ for safety and balance; pt demo increased tremors with activity; demo decreased safety awarenss when he becomes fatigued he attempts to sit without chair being positioned Gait Pattern: Step-to pattern;Decreased stance time - right;Decreased step length - left;Antalgic;Narrow base of support;Trunk flexed Gait velocity: decreased Stairs: No Wheelchair Mobility Wheelchair Mobility: No    Exercises General Exercises - Lower Extremity Ankle Circles/Pumps: 10 reps;Both;AROM   PT Diagnosis:    PT Problem List:   PT Treatment Interventions:     PT Goals (current goals can now be found in the care plan section) Acute Rehab PT Goals Patient Stated Goal: to get better PT Goal Formulation: With patient Time For Goal Achievement: 09/13/12 Potential to Achieve Goals: Fair  Visit Information  Last PT Received On: 09/07/12 Assistance Needed: +2 History of Present Illness: s/p fall resulting in R patella fx    Subjective Data  Subjective: Pt lying supine with daugther present; agreeable  to therapy. Stated "i had a rough morning and wanted to go home to jesus. Im glad you are here to help me move" Patient Stated Goal: to get better   Cognition   Cognition Arousal/Alertness: Awake/alert Behavior During Therapy: Flat affect Overall Cognitive Status: Within Functional Limits for tasks assessed    Balance  Balance Balance Assessed: Yes Static Standing Balance Static Standing - Balance Support: Bilateral upper extremity supported;During functional activity Static Standing - Level of Assistance: 1: +2 Total assist  End of Session PT - End of Session Equipment Utilized During Treatment: Gait belt;Other (comment) (R LE hinged brace) Activity Tolerance: Patient tolerated treatment well Patient left: in chair;with call bell/phone within reach;with family/visitor present Nurse Communication: Mobility status   GP     Donell Sievert, [place] [phone] 09/07/2012, 3:11 PM

## 2012-09-07 NOTE — Progress Notes (Signed)
Pt has been making comments that he felt that he could not go on living.  He has asked if we practiced euthanasia and said he was going to die.  When I asked him to explain himself, he said that his pain was too severe and did not think he could make a complete recovery.  I assured patient that some pain is part of the recovery process and his mobility would gradually increase.  Dr. Rhona Leavens made aware of pt's comments, nsg to continue to monitor.

## 2012-09-07 NOTE — Progress Notes (Signed)
   Subjective:  Patient states that he had some chest pain yesterday relieved by SL NTG.  Objective:  Vital Signs in the last 24 hours: Temp:  [98.3 F (36.8 C)-100.1 F (37.8 C)] 100.1 F (37.8 C) (07/23 2200) Pulse Rate:  [82-95] 82 (07/23 2200) Resp:  [18] 18 (07/24 0000) BP: (158-184)/(60-78) 163/66 mmHg (07/23 2200) SpO2:  [95 %-97 %] 97 % (07/24 0000)  Intake/Output from previous day: 07/23 0701 - 07/24 0700 In: 240 [P.O.:240] Out: -  Intake/Output from this shift:    . amitriptyline  10 mg Oral QHS  . amLODipine  10 mg Oral QHS  . aspirin  81 mg Oral Daily  . glipiZIDE  10 mg Oral BID AC  . insulin aspart  0-9 Units Subcutaneous Q4H  . isosorbide dinitrate  60 mg Oral BID  . lisinopril  20 mg Oral BID      Physical Exam: The patient appears to be in no distress.  Head and neck exam reveals that the pupils are equal and reactive.  The extraocular movements are full.  There is no scleral icterus.  Mouth and pharynx are benign.  No lymphadenopathy.  No carotid bruits.  The jugular venous pressure is normal.  Thyroid is not enlarged or tender.  Chest is clear to percussion and auscultation.  No rales or rhonchi.  Expansion of the chest is symmetrical.  Heart reveals no abnormal lift or heave.  First and second heart sounds are normal.  There is no murmur gallop rub or click.  The abdomen is soft and nontender.  Bowel sounds are normoactive.  There is no hepatosplenomegaly or mass.  There are no abdominal bruits.  Extremities reveal no phlebitis or edema.  Pedal pulses are good.  There is no cyanosis or clubbing.  Neurologic exam is normal strength and no lateralizing weakness.  No sensory deficits.  Integument reveals no rash  Lab Results:  Recent Labs  09/05/12 0845  WBC 6.1  HGB 14.5  PLT 135*    Recent Labs  09/05/12 0845  NA 139  K 4.4  CL 101  CO2 27  GLUCOSE 207*  BUN 30*  CREATININE 1.40*    Recent Labs  09/05/12 0843 09/05/12 1532    TROPONINI <0.30 <0.30   Hepatic Function Panel  Recent Labs  09/05/12 0845  PROT 6.9  ALBUMIN 3.6  AST 13  ALT 9  ALKPHOS 93  BILITOT 0.6   No results found for this basename: CHOL,  in the last 72 hours No results found for this basename: PROTIME,  in the last 72 hours  Imaging: Imaging results have been reviewed  Cardiac Studies: Telemetry shows NSR with rare PVC Assessment/Plan:  1. Ischemic heart disease S/P CABG. 2. Chronic angina pectoris 3. Chronically low functional level.  Plan: Remains at moderate risk for cardiovascular complications. Continue current cardiac meds. Intolerant of statins.  LOS: 2 days    Cassell Clement 09/07/2012, 8:12 AM

## 2012-09-07 NOTE — Progress Notes (Signed)
I also examined patient this pm, with [name] again, and helped to mobilize patient, adjust brace, and answer questions with daughter.  Plan as above  Myrene Galas, MD [facility] [phone] (p)

## 2012-09-07 NOTE — Clinical Social Work Placement (Addendum)
Clinical Social Work Department  CLINICAL SOCIAL WORK PLACEMENT NOTE  09/07/2012 Patient: [name] Account Number: [id number]  Admit date: 09/03/12  Clinical Social Worker: Sabino Niemann LCSWA Date/time: 09/07/2012  Clinical Social Work is seeking post-discharge placement for this patient at the following level of care: SKILLED NURSING (*CSW will update this form in Epic as items are completed)  09/07/2012 Patient/family provided with [facility] Department of Clinical Social Work's list of facilities offering this level of care within the geographic area requested by the patient (or if unable, by the patient's family).  09/07/2012 Patient/family informed of their freedom to choose among providers that offer the needed level of care, that participate in Medicare, Medicaid or managed care program needed by the patient, have an available bed and are willing to accept the patient.  09/07/2012 Patient/family informed of [facility]' ownership interest in [facility], as well as of the fact that they are under no obligation to receive care at this facility.  PASARR submitted to EDS on  PASARR number received from EDS on  FL2 transmitted to all facilities in geographic area requested by pt/family on 09/07/2012  FL2 transmitted to all facilities within larger geographic area on  Patient informed that his/her managed care company has contracts with or will negotiate with certain facilities, including the following:  Patient/family informed of bed offers received:  Patient chooses bed at [facility] Physician recommends and patient chooses bed at  Patient to be transferred to [facility] on 09/09/12  Patient to be transferred to facility by ambulance The following physician request were entered in Epic:   Additional Comments:  09/09/12: Family contacted by bedside nurse to advise that transport being called for patient.

## 2012-09-07 NOTE — Progress Notes (Signed)
[facility]  Subjective:  Pt tearful this am. Stating he is a burden on his family.  Upset that he cannot take care of his family like he used to.  Pt reports some CP yesterday and some this am. No nitro this am Worked with PT yesterday, 2+ assist No significant pain R knee  Hinged knee brace fitting well and locked to full extension     Objective: Vital signs in last 24 hours: Temp:  [98.6 F (37 C)-100.1 F (37.8 C)] 100.1 F (37.8 C) (07/23 2200) Pulse Rate:  [82-95] 82 (07/23 2200) Resp:  [18] 18 (07/24 0000) BP: (158-184)/(60-78) 163/66 mmHg (07/23 2200) SpO2:  [95 %-97 %] 97 % (07/24 0000)  Intake/Output from previous day: 07/23 0701 - 07/24 0700 In: 240 [P.O.:240] Out: -  Intake/Output this shift:     Recent Labs  09/05/12 0845  HGB 14.5    Recent Labs  09/05/12 0845  WBC 6.1  RBC 4.77  HCT 41.6  PLT 135*    Recent Labs  09/05/12 0845  NA 139  K 4.4  CL 101  CO2 27  BUN 30*  CREATININE 1.40*  GLUCOSE 207*  CALCIUM 9.2    Recent Labs  09/05/12 0845  INR 1.09    Phyical Exam  Gen: awake, appears comfortable Cardiac: s1 and s2, reg Lungs: CTA B  Ext:             Right Lower Extremity                                     Mild effusion R knee                         No significant swelling distally                           Unable to perform SLR                         Distal motor and sensory functions intact                         + DP pulse    Assessment/Plan:  79 year old male with complex medical history status post fall and right patella fracture  1. Fall 2. Right patella fracture  Still think non-op treatment is correct thing to do at this time  Family and pt in agreement  Feel pt needs snf despite conversations yesterday with the family indicating that he may want to go home   Continue with PT/OT  Brace at all times except for hygiene   Ice PRN    Elevate above heart at rest                   Non-op tx                           WBAT in full extension in brace x 6-8 weeks                         No R knee ROM x 2 weeks  Then increase knee flexion by 30 degrees each week starting in week 2                           Pt would be restricted from active knee extension and extension agains resistance x 6-8 weeks to prevent tension stress on patella. Passive extension permitted after week 2                            (these restrictions would the same for surgery as well)  3. medical issues              Per cardiology and internal medicine  4. FEN             Carb modified/cardiac diet  5. Pain             Continue with Norco and morphine as needed for pain control  6. DVT and PE prophylaxis             Patient on daily low-dose aspirin             SCDs have been ordered  Defer additional pharmacologics to IM  7. Disposition             SNF  Pt may also be experiencing some depression related to medical conditions. ? Psych consult, defer to medicine      Mearl Latin, PA-C [facility] [phone] (P) 09/07/2012, 9:27 AM

## 2012-09-07 NOTE — Consult Note (Signed)
I have seen and examined the patient. I agree with the findings above.  Budd Palmer, MD 09/07/2012 10:28 PM

## 2012-09-07 NOTE — Progress Notes (Signed)
[facility] PROGRESS NOTE  [name] ZHY:[id number] DOB: 1928-10-03 DOA: 09/05/2012 PCP: [name], MD  Assessment/Plan: Comminuted patellar fracture with knee effusion and subcutaneous hematoma of right leg status post mechanical fall - Will defer to ortho. Focus on non-operative mgt Recs for SNF  Chest pain with history of CAD status post CABG - continued CP this AM. Cardiology following. Hx of chronic angina. Cont meds for now. Cont NTG PRN. Defer additional w/u to Cardiology.   Left periorbital laceration and hematoma status post mechanical fall - has been sutured at the ER in [facility].   Probable chronic kidney disease - closely follow intake output and metabolic panel.   History of ischemic cardiomyopathy - reports of cp recently. Cardiology following. Cont current meds for now   Diabetes mellitus type 2 - CBS have been stable thus far   Hypertension - continue present medications. Elevated this AM.   History of carotid endarterectomy.   History of prostate cancer.   Fever: No fevers  Code Status: Full Family Communication: Pt in room (indicate person spoken with, relationship, and if by phone, the number) Disposition Plan: Pending  Consultants:  Orthopedic surgery  Cardiology  HPI/Subjective: No acute events noted. Pt reports continued R knee pain.  Objective: Filed Vitals:   09/06/12 2000 09/06/12 2200 09/07/12 0000 09/07/12 1006  BP:  163/66  162/68  Pulse:  82  78  Temp:  100.1 F (37.8 C)    TempSrc:  Oral    Resp: 18 18 18    SpO2: 95% 97% 97%     Intake/Output Summary (Last 24 hours) at 09/07/12 1141 Last data filed at 09/06/12 1300  Gross per 24 hour  Intake    120 ml  Output      0 ml  Net    120 ml   There were no vitals filed for this visit.  Exam:   General:  Awake, in nad  Cardiovascular: regular, s1, s2  Respiratory: normal resp effort, no wheezing  Abdomen: soft, nondistended  Musculoskeletal:  perfused,no clubbing or cyanosis   Data Reviewed: Basic Metabolic Panel:  Recent Labs Lab 09/05/12 0845  NA 139  K 4.4  CL 101  CO2 27  GLUCOSE 207*  BUN 30*  CREATININE 1.40*  CALCIUM 9.2   Liver Function Tests:  Recent Labs Lab 09/05/12 0845  AST 13  ALT 9  ALKPHOS 93  BILITOT 0.6  PROT 6.9  ALBUMIN 3.6   No results found for this basename: LIPASE, AMYLASE,  in the last 168 hours No results found for this basename: AMMONIA,  in the last 168 hours CBC:  Recent Labs Lab 09/05/12 0845  WBC 6.1  NEUTROABS 4.3  HGB 14.5  HCT 41.6  MCV 87.2  PLT 135*   Cardiac Enzymes:  Recent Labs Lab 09/05/12 0843 09/05/12 1532  TROPONINI <0.30 <0.30   BNP (last 3 results) No results found for this basename: PROBNP,  in the last 8760 hours CBG:  Recent Labs Lab 09/06/12 2059 09/07/12 0013 09/07/12 0402 09/07/12 0847 09/07/12 1108  GLUCAP 171* 163* 112* 129* 156*    Recent Results (from the past 240 hour(s))  URINE CULTURE     Status: None   Collection Time    09/05/12  3:41 AM      Result Value Range Status   Specimen Description URINE, CLEAN CATCH   Final   Special Requests NONE   Final   Culture  Setup Time 09/05/2012 04:42   Final  Colony Count >=100,000 COLONIES/ML   Final   Culture     Final   Value: STAPHYLOCOCCUS SPECIES (COAGULASE NEGATIVE)     Note: RIFAMPIN AND GENTAMICIN SHOULD NOT BE USED AS SINGLE DRUGS FOR TREATMENT OF STAPH INFECTIONS.   Report Status 09/07/2012 FINAL   Final   Organism ID, Bacteria STAPHYLOCOCCUS SPECIES (COAGULASE NEGATIVE)   Final     Studies: Ct Knee Right Wo Contrast  09/05/2012   *RADIOLOGY REPORT*  Clinical Data: Patella fracture  CT OF THE RIGHT KNEE WITHOUT CONTRAST  Technique:  Multidetector CT imaging was performed according to the standard protocol. Multiplanar CT image reconstructions were also generated.  Comparison: 09/05/2012 0700 hours  Findings: A comminuted nondisplaced fracture within the patella  extending into the patellofemoral joint is noted.  The main component of the fracture is primarily sagittal splaying the patella into right and left fragments.  There is also a horizontal component within the inferior aspect of the medial fragment.  Small fragment that is versus superiorly are noted.  Joint hemarthrosis is present.  The patellar tendon and quadriceps tendon are grossly intact. Medial and lateral retinacula are grossly intact. PCL is grossly intact. The ACL is obscured.  Advanced degenerative changes in all three compartments are superimposed.  The femur, tibia, and fibula are intact.  IMPRESSION: Comminuted intra-articular patella fracture.   Original Report Authenticated By: Jolaine Click, M.D.    Scheduled Meds: . amitriptyline  10 mg Oral QHS  . amLODipine  10 mg Oral QHS  . aspirin  81 mg Oral Daily  . glipiZIDE  10 mg Oral BID AC  . insulin aspart  0-9 Units Subcutaneous Q4H  . isosorbide dinitrate  60 mg Oral BID  . lisinopril  20 mg Oral BID   Continuous Infusions:   Principal Problem:   Patellar fracture Active Problems:   Chest pain   CAD (coronary artery disease) of artery bypass graft   HTN (hypertension)   Renal failure   Diabetes mellitus    Time spent:    Isaah Furry K  [facility] Pager [phone]. If 7PM-7AM, please contact night-coverage at [email or web address], password [facility] 09/07/2012, 11:41 AM  LOS: 2 days

## 2012-09-07 NOTE — Progress Notes (Signed)
I communicated the findings and plan noted above.  Myrene Galas, MD [facility] [phone] (p)

## 2012-09-08 DIAGNOSIS — F329 Major depressive disorder, single episode, unspecified: Secondary | ICD-10-CM

## 2012-09-08 LAB — GLUCOSE, CAPILLARY
Glucose-Capillary: 144 mg/dL — ABNORMAL HIGH (ref 70–99)
Glucose-Capillary: 140 mg/dL — ABNORMAL HIGH (ref 70–99)
Glucose-Capillary: 142 mg/dL — ABNORMAL HIGH (ref 70–99)

## 2012-09-08 MED ORDER — HYDROCODONE-ACETAMINOPHEN 5-325 MG PO TABS: 1.0000 | ORAL_TABLET | Freq: Four times a day (QID) | ORAL | Status: DC | PRN

## 2012-09-08 NOTE — Progress Notes (Signed)
   Subjective:  Patient is more "upbeat" this am. Looking forward to discharge. Denies chest pain overnight.  Objective:  Vital Signs in the last 24 hours: Temp:  [97.8 F (36.6 C)-99 F (37.2 C)] 98.4 F (36.9 C) (07/25 0554) Pulse Rate:  [67-78] 67 (07/25 0554) Resp:  [16-18] 16 (07/25 0554) BP: (136-162)/(53-68) 139/61 mmHg (07/25 0554) SpO2:  [91 %-96 %] 96 % (07/25 0554) Weight:  [195 lb (88.451 kg)] 195 lb (88.451 kg) (07/24 1300)  Intake/Output from previous day: 07/24 0701 - 07/25 0700 In: -  Out: 150 [Urine:150] Intake/Output from this shift:    . amitriptyline  10 mg Oral QHS  . amLODipine  10 mg Oral QHS  . aspirin  81 mg Oral Daily  . glipiZIDE  10 mg Oral BID AC  . insulin aspart  0-9 Units Subcutaneous Q4H  . isosorbide dinitrate  60 mg Oral BID  . lisinopril  20 mg Oral BID      Physical Exam: The patient appears to be in no distress.  Head and neck exam reveals that the pupils are equal and reactive.  The extraocular movements are full.  There is no scleral icterus.  Mouth and pharynx are benign.  No lymphadenopathy.  No carotid bruits.  The jugular venous pressure is normal.  Thyroid is not enlarged or tender.  Chest is clear to percussion and auscultation.  No rales or rhonchi.  Expansion of the chest is symmetrical.  Heart reveals no abnormal lift or heave.  First and second heart sounds are normal.  There is no murmur gallop rub or click.  The abdomen is soft and nontender.  Bowel sounds are normoactive.  There is no hepatosplenomegaly or mass.  There are no abdominal bruits.  Extremities reveal no phlebitis or edema.  Pedal pulses are good.  There is no cyanosis or clubbing.  Neurologic exam is normal strength and no lateralizing weakness.  No sensory deficits.  Integument reveals no rash  Lab Results:  Recent Labs  09/05/12 0845  WBC 6.1  HGB 14.5  PLT 135*    Recent Labs  09/05/12 0845  NA 139  K 4.4  CL 101  CO2 27  GLUCOSE  207*  BUN 30*  CREATININE 1.40*    Recent Labs  09/05/12 0843 09/05/12 1532  TROPONINI <0.30 <0.30   Hepatic Function Panel  Recent Labs  09/05/12 0845  PROT 6.9  ALBUMIN 3.6  AST 13  ALT 9  ALKPHOS 93  BILITOT 0.6   No results found for this basename: CHOL,  in the last 72 hours No results found for this basename: PROTIME,  in the last 72 hours  Imaging: Imaging results have been reviewed  Cardiac Studies: Telemetry shows NSR with rare PVC Assessment/Plan:  1. Ischemic heart disease S/P CABG. 2. Chronic angina pectoris 3. Chronically low functional level.  Plan: Continue current cardiac Rx.   LOS: 3 days    [name] 09/08/2012, 8:21 AM

## 2012-09-08 NOTE — Consult Note (Signed)
Reason for Consult: capacity evaluation and depression Referring Physician: Dr. Eual Fines is an 79 y.o. male.  HPI: Patient is seen and chart reviewed get psychiatric consultation was called for request evaluation and possible medication management for depression. Reportedly patient lost balance at home and fell on his face and also broken patella which was reconstructed. Patient refusing rehabilitation services and os are talked about death. Patient stated that he wishes to go to rehabilitation close to at home not in [place] he also stated that he does not mean when talking about death. Patient has no history of for psychiatric hospitalizations and has been taking amitriptyline for sleep from primary care physician. Patient appreciate the consultation and softly refuses psychiatric medication management.  Mental Status Examination: Patient appeared as per his stated age, lying in his bed, it makes, and alert and oriented, and maintaining good eye contact. Patient has good mood and his affect was appropriate and bright and full. He has normal rate, rhythm, and volume of speech. His thought process is linear and goal directed. Patient has denied suicidal, homicidal ideations, intentions or plans. Patient has no evidence of auditory or visual hallucinations, delusions, and paranoia. Patient has fair insight judgment and impulse control.  Past Medical History  Diagnosis Date  . Diabetes mellitus   . Hypertension   . Hyperlipidemia   . Coronary artery disease   . Cancer of prostate   . Stroke 2000  . Carotid stenosis     Past Surgical History  Procedure Laterality Date  . Carotid endarterectomy  02/01/2007  . Carotid endarterectomy  03/18/2010    right  . Coronary artery bypass graft  12/24/1996    x6  . Transurethral resection of prostate  2008  . Joint replacement      Left hip    Family History  Problem Relation Age of Onset  . Heart disease Father 34    Heart Disease  before age 40  . Stroke Sister   . Diabetes Sister     x 2 with diabetes  . CAD Brother 41  . Diabetes Brother   . CAD Son 14    Social History:  reports that he has never smoked. He has never used smokeless tobacco. He reports that he does not drink alcohol or use illicit drugs.  Allergies:  Allergies  Allergen Reactions  . Lipitor (Atorvastatin) Other (See Comments)    GI  And  " pt could not talk"  . Reglan (Metoclopramide) Other (See Comments)    Trimmers " pt can't talk"  . Zocor (Simvastatin)     GI  . Codeine Nausea And Vomiting  . Baycol (Cerivastatin)   . Metoclopramide Hcl     Medications: I have reviewed the patient's current medications.  Results for orders placed during the hospital encounter of 09/05/12 (from the past 48 hour(s))  GLUCOSE, CAPILLARY     Status: Abnormal   Collection Time    09/06/12  4:46 PM      Result Value Range   Glucose-Capillary 155 (*) 70 - 99 mg/dL  GLUCOSE, CAPILLARY     Status: Abnormal   Collection Time    09/06/12  8:59 PM      Result Value Range   Glucose-Capillary 171 (*) 70 - 99 mg/dL   Comment 1 Notify RN    GLUCOSE, CAPILLARY     Status: Abnormal   Collection Time    09/07/12 12:13 AM      Result Value Range  Glucose-Capillary 163 (*) 70 - 99 mg/dL  GLUCOSE, CAPILLARY     Status: Abnormal   Collection Time    09/07/12  4:02 AM      Result Value Range   Glucose-Capillary 112 (*) 70 - 99 mg/dL  GLUCOSE, CAPILLARY     Status: Abnormal   Collection Time    09/07/12  8:47 AM      Result Value Range   Glucose-Capillary 129 (*) 70 - 99 mg/dL   Comment 1 Notify RN    GLUCOSE, CAPILLARY     Status: Abnormal   Collection Time    09/07/12 11:08 AM      Result Value Range   Glucose-Capillary 156 (*) 70 - 99 mg/dL   Comment 1 Notify RN    GLUCOSE, CAPILLARY     Status: Abnormal   Collection Time    09/07/12  3:56 PM      Result Value Range   Glucose-Capillary 184 (*) 70 - 99 mg/dL   Comment 1 Notify RN    GLUCOSE,  CAPILLARY     Status: Abnormal   Collection Time    09/07/12  7:55 PM      Result Value Range   Glucose-Capillary 143 (*) 70 - 99 mg/dL   Comment 1 Notify RN    GLUCOSE, CAPILLARY     Status: Abnormal   Collection Time    09/08/12 12:03 AM      Result Value Range   Glucose-Capillary 164 (*) 70 - 99 mg/dL   Comment 1 Notify RN    GLUCOSE, CAPILLARY     Status: Abnormal   Collection Time    09/08/12  4:08 AM      Result Value Range   Glucose-Capillary 118 (*) 70 - 99 mg/dL   Comment 1 Notify RN    GLUCOSE, CAPILLARY     Status: Abnormal   Collection Time    09/08/12  8:26 AM      Result Value Range   Glucose-Capillary 144 (*) 70 - 99 mg/dL   Comment 1 Notify RN    GLUCOSE, CAPILLARY     Status: Abnormal   Collection Time    09/08/12 11:25 AM      Result Value Range   Glucose-Capillary 140 (*) 70 - 99 mg/dL   Comment 1 Notify RN      No results found.  Positive for anxiety, depression and sleep disturbance Blood pressure 139/61, pulse 67, temperature 98.4 F (36.9 C), temperature source Oral, resp. rate 18, height 6\' 3"  (1.905 m), weight 88.451 kg (195 lb), SpO2 96.00%.   Assessment/Plan: Depression not otherwise specified  Recommendation: Patient has full capacity to make his own medical decisions and living arrangements  if needed Patient does not meet criteria for acute psychiatric hospitalization Patient does not endorses safety issues Recommend to continue his current medication management and follow up with outpatient psychiatric services Appreciate psychiatric consultation and will sign off at this time  Bryttney Netzer,JANARDHAHA R. 09/08/2012, 12:31 PM

## 2012-09-08 NOTE — Progress Notes (Signed)
[facility] PROGRESS NOTE  [name] Fan WRU:[id number] DOB: Oct 15, 1928 DOA: 09/05/2012 PCP: [name], MD  Assessment/Plan: Comminuted patellar fracture with knee effusion and subcutaneous hematoma of right leg status post mechanical fall - Focus on non-operative mgt Recs for SNF. Chest pain with history of CAD status post CABG - Hx of chronic angina. Cont meds for now. Cont NTG PRN.  Left periorbital laceration and hematoma status post mechanical fall - has been sutured at the ER in [facility].  Probable chronic kidney disease - closely follow intake output and metabolic panel.  History of ischemic cardiomyopathy -Stable.  Cont current meds for now  Diabetes mellitus type 2 - CBS have been stable thus far  Hypertension - continue present medications History of carotid endarterectomy.  History of prostate cancer.  Fever: No fevers Depression: - See notes. Pt with feelings of depression and hopelessness. Had previously mentioned "euthanasia" to staff - Psychiatry consulted  Code Status: Full Family Communication: Pt in room (indicate person spoken with, relationship, and if by phone, the number) Disposition Plan: Pending  Consultants:  Psychiatry  Orthopedic surgery  Cardiology  HPI/Subjective: No major complaints today. Wants to go home instead of SNF.  Objective: Filed Vitals:   09/08/12 0800 09/08/12 1200 09/08/12 1300 09/08/12 1555  BP:   123/63   Pulse:   71   Temp:   99.4 F (37.4 C)   TempSrc:   Oral   Resp: 18 18 18 18   Height:      Weight:      SpO2:   90%     Intake/Output Summary (Last 24 hours) at 09/08/12 1724 Last data filed at 09/07/12 1900  Gross per 24 hour  Intake      0 ml  Output    150 ml  Net   -150 ml   Filed Weights   09/07/12 1300  Weight: 88.451 kg (195 lb)    Exam:   General:  Awake, in nad  Cardiovascular: regular, s1, s2  Respiratory: normal resp effort, no wheezing  Abdomen: soft,  nondistended  Musculoskeletal: perfused, no clubbing   Data Reviewed: Basic Metabolic Panel:  Recent Labs Lab 09/05/12 0845  NA 139  K 4.4  CL 101  CO2 27  GLUCOSE 207*  BUN 30*  CREATININE 1.40*  CALCIUM 9.2   Liver Function Tests:  Recent Labs Lab 09/05/12 0845  AST 13  ALT 9  ALKPHOS 93  BILITOT 0.6  PROT 6.9  ALBUMIN 3.6   No results found for this basename: LIPASE, AMYLASE,  in the last 168 hours No results found for this basename: AMMONIA,  in the last 168 hours CBC:  Recent Labs Lab 09/05/12 0845  WBC 6.1  NEUTROABS 4.3  HGB 14.5  HCT 41.6  MCV 87.2  PLT 135*   Cardiac Enzymes:  Recent Labs Lab 09/05/12 0843 09/05/12 1532  TROPONINI <0.30 <0.30   BNP (last 3 results) No results found for this basename: PROBNP,  in the last 8760 hours CBG:  Recent Labs Lab 09/08/12 0003 09/08/12 0408 09/08/12 0826 09/08/12 1125 09/08/12 1551  GLUCAP 164* 118* 144* 140* 142*    Recent Results (from the past 240 hour(s))  URINE CULTURE     Status: None   Collection Time    09/05/12  3:41 AM      Result Value Range Status   Specimen Description URINE, CLEAN CATCH   Final   Special Requests NONE   Final   Culture  Setup  Time 09/05/2012 04:42   Final   Colony Count >=100,000 COLONIES/ML   Final   Culture     Final   Value: STAPHYLOCOCCUS SPECIES (COAGULASE NEGATIVE)     Note: RIFAMPIN AND GENTAMICIN SHOULD NOT BE USED AS SINGLE DRUGS FOR TREATMENT OF STAPH INFECTIONS.   Report Status 09/07/2012 FINAL   Final   Organism ID, Bacteria STAPHYLOCOCCUS SPECIES (COAGULASE NEGATIVE)   Final     Studies: No results found.  Scheduled Meds: . amitriptyline  10 mg Oral QHS  . amLODipine  10 mg Oral QHS  . aspirin  81 mg Oral Daily  . glipiZIDE  10 mg Oral BID AC  . insulin aspart  0-9 Units Subcutaneous Q4H  . isosorbide dinitrate  60 mg Oral BID  . lisinopril  20 mg Oral BID   Continuous Infusions:   Principal Problem:   Patellar fracture Active  Problems:   Chest pain   CAD (coronary artery disease) of artery bypass graft   HTN (hypertension)   Renal failure   Diabetes mellitus    Time spent:    CHIU, STEPHEN K  [facility] Pager [phone]. If 7PM-7AM, please contact night-coverage at [email or web address], password [facility] 09/08/2012, 5:24 PM  LOS: 3 days

## 2012-09-08 NOTE — Discharge Summary (Signed)
Physician Discharge Summary  [name] ZOX:[id number] DOB: 1928-11-17 DOA: 09/05/2012  PCP: [name], MD  Admit date: 09/05/2012 Discharge date: 09/09/2012  Time spent: 35 minutes  Recommendations for Outpatient Follow-up:  1. Follow up with PCP in 1-2 weeks 2. WBAT in full extension in brace x 6-8 weeks  3. No R knee ROM x 2 weeks Then increase knee flexion by 30 degrees each week starting in week 2  4. Pt would be restricted from active knee extension and extension agains resistance x 6-8 weeks to prevent tension stress on patella. 5. Passive extension permitted after week 2  6. Follow up with Psychiatry as an outpatient at next available apointment 7. Follow up with Dr. Antoine Poche in one month  Discharge Diagnoses:  Principal Problem:   Patellar fracture Active Problems:   Chest pain   CAD (coronary artery disease) of artery bypass graft   HTN (hypertension)   Renal failure   Diabetes mellitus  Discharge Condition: Stable  Diet recommendation: Regular  Filed Weights   09/07/12 1300  Weight: 88.451 kg (195 lb)    History of present illness:  [name] is a 79 y.o. male history of CAD status post CABG and last cardiac catheter in 2010, diabetes mellitus type 2, hypertension, hyperlipidemia, prostate cancer had a fall while shopping. During the fall he also hit his head and had sustained a laceration on his left periorbital area. At that time he had refused to go to the ER and went back to his house where he started developing pain in his right knee with swelling. At that point patient went to the ER and was found to have right knee comminuted patellar fracture with knee effusion and subcutaneous edema. CT head and maxillofacial done showed left periorbital soft tissue swelling. CT head was negative for any acute fractures. On-call orthopedic surgeon Dr. Shon Baton was consulted by the ER physician and at this time patient has been transferred to [facility]  for further management. After the fall patient also had chest pain which was retrosternal and lasted for a few minutes. Denies any associated shortness of breath sweating nausea vomiting abdominal pain or diarrhea. Chest the patient is chest pain-free. EKG done at [facility] showed normal sinus rhythm with RBBB.  Hospital Course:  Comminuted patellar fracture with knee effusion and subcutaneous hematoma of right leg status post mechanical fall -  The patient was admitted to the inpatient service. Orthopedic surgery was consulted. Recommendations per Ortho were for non-surgical management. The patient was fitted with a locking knee brace with recommendations per above. The patient was noted to have bouts of intermittent chest pain, seen by Cardiology, in the setting of known chronic angina. Per PT, recs were noted for SNF. During this hospital course, the patient was noted to have a flat affect with suggestions of depression. Psychiatry was consulted. Recommendations were for continued antidepressant and follow up with psychiatry as an outpatient.   Just prior to discharge, the patient was noted to have questionable ventricular dysrhythmias which were likely felt to be possible PVC's. Cardiology recommendations were for a low dose beta blocker with close follow up with Cardiology in one month.  The patient remained medically stable and is otherwise appropriate for close outpatient follow up.  Consultations:  Orthopedic Surgery  Cardiology  Psychiatry  Discharge Exam: Filed Vitals:   09/08/12 2023 09/08/12 2133 09/09/12 0500 09/09/12 0800  BP: 148/73 146/55 124/65   Pulse: 75 74 74   Temp: 98.7  F (37.1 C) 98.8 F (37.1 C) 98.6 F (37 C)   TempSrc: Oral Oral Oral   Resp: 18 18 18 16   Height:      Weight:      SpO2: 94% 93% 94% 96%    General: Awake, in nad Cardiovascular: regular, s1, s2 Respiratory: normal resp effort, no wheezing   Discharge Instructions      Discharge  Orders   Future Appointments Provider Department Dept Phone   04/17/2013 1:00 PM [facility] [phone]   04/17/2013 2:20 PM Evern Bio, NP [facility] [phone]   Future Orders Complete By Expires     Apply ice to affected area  As directed     PT range of motion  As directed 09/08/2013    Comments:      RANGE OF MOTION/ACTIVITY: No Range of motion or Right knee at this time.  Ok to move hip and ankle. Brace to be worn at all times except for hygiene. If pt mobilizing to shower he must wear brace to shower, can then shower without brace on in shower chair. Then brace is to be put back on before mobilizing pt.    Weight bearing as tolerated  As directed     Comments:      Weightbear as tolerated Right leg with brace on, locked in full extension        Medication List    STOP taking these medications       BC HEADACHE POWDER PO      TAKE these medications       amitriptyline 10 MG tablet  Commonly known as:  ELAVIL  Take 10 mg by mouth at bedtime.     amLODipine 10 MG tablet  Commonly known as:  NORVASC  Take 10 mg by mouth at bedtime.     carvedilol 3.125 MG tablet  Commonly known as:  COREG  Take 1 tablet (3.125 mg total) by mouth 2 (two) times daily with a meal.     furosemide 20 MG tablet  Commonly known as:  LASIX  Take 20 mg by mouth.     glipiZIDE 10 MG tablet  Commonly known as:  GLUCOTROL  Take 10 mg by mouth 2 (two) times daily before a meal.     HYDROcodone-acetaminophen 5-325 MG per tablet  Commonly known as:  NORCO/VICODIN  Take 1-2 tablets by mouth every 6 (six) hours as needed.     indomethacin 25 MG capsule  Commonly known as:  INDOCIN  Take 25 mg by mouth daily.     isosorbide dinitrate 30 MG tablet  Commonly known as:  ISORDIL  Take 30 mg by mouth. Take 2 tabs bid     lisinopril 20 MG tablet  Commonly known as:  PRINIVIL,ZESTRIL  Take 20 mg by mouth 2 (two) times  daily.     LORazepam 1 MG tablet  Commonly known as:  ATIVAN  Take 0.5-1 mg by mouth every 6 (six) hours as needed for anxiety.     nitroGLYCERIN 0.4 MG SL tablet  Commonly known as:  NITROSTAT  Place 1 tablet (0.4 mg total) under the tongue every 5 (five) minutes as needed.       Allergies  Allergen Reactions  . Lipitor (Atorvastatin) Other (See Comments)    GI  And  " pt could not talk"  . Reglan (Metoclopramide) Other (See Comments)    Trimmers " pt can't talk"  .  Zocor (Simvastatin)     GI  . Codeine Nausea And Vomiting  . Baycol (Cerivastatin)   . Metoclopramide Hcl    Follow-up Information   Follow up with HANDY,MICHAEL H, MD. Schedule an appointment as soon as possible for a visit in 10 days. (call for appointment )    Contact information:   [address] [place] [address] [phone]       Schedule an appointment as soon as possible for a visit with [name], MD.   Contact information:   [address] [place] [address] [phone]       Follow up with Rollene Rotunda, MD In 1 month. (follow up with Behavioral Health at earliest appointment)    Contact information:   [address] [place] [address] [phone]        The results of significant diagnostics from this hospitalization (including imaging, microbiology, ancillary and laboratory) are listed below for reference.    Significant Diagnostic Studies: Ct Knee Right Wo Contrast  09/05/2012   *RADIOLOGY REPORT*  Clinical Data: Patella fracture  CT OF THE RIGHT KNEE WITHOUT CONTRAST  Technique:  Multidetector CT imaging was performed according to the standard protocol. Multiplanar CT image reconstructions were also generated.  Comparison: 09/05/2012 0700 hours  Findings: A comminuted nondisplaced fracture within the patella extending into the patellofemoral joint is noted.  The main component of the  fracture is primarily sagittal splaying the patella into right and left fragments.  There is also a horizontal component within the inferior aspect of the medial fragment.  Small fragment that is versus superiorly are noted.  Joint hemarthrosis is present.  The patellar tendon and quadriceps tendon are grossly intact. Medial and lateral retinacula are grossly intact. PCL is grossly intact. The ACL is obscured.  Advanced degenerative changes in all three compartments are superimposed.  The femur, tibia, and fibula are intact.  IMPRESSION: Comminuted intra-articular patella fracture.   Original Report Authenticated By: Jolaine Click, M.D.   Dg Chest Portable 1 View  09/05/2012   *RADIOLOGY REPORT*  Clinical Data: Preop right knee replacement.  PORTABLE CHEST - 1 VIEW  Comparison: 09/04/2012  Findings: Prior CABG.  Low lung volumes with vascular congestion and bibasilar atelectasis.  Heart size is borderline, accentuated by the low volumes.  No visible significant effusion or acute bony abnormality.  IMPRESSION: Increasing vascular congestion and bibasilar atelectasis.  Low lung volumes.   Original Report Authenticated By: Charlett Nose, M.D.   Dg Knee Right Port  09/05/2012   *RADIOLOGY REPORT*  Clinical Data: Preoperative examination (patellar fracture)  PORTABLE RIGHT KNEE - 1-2 VIEW  Comparison: 09/04/2012  Findings:  Grossly unchanged appearance and alignment of previously identified comminuted fracture of the patella with extension to involve the patellofemoral joint.  Small joint effusion.  No definite evidence of lipohemarthrosis.  No additional displaced fractures identified.  Moderate tricompartmental degenerative change, likely worse within the medial compartment with joint space loss, subchondral sclerosis and osteophytosis.  There is spurring and irregularity of the tibial spines.  Vascular calcifications.  No radiopaque foreign body.  IMPRESSION: Unchanged appearance of alignment of comminuted  intra-articular patellar fracture.   Original Report Authenticated By: Tacey Ruiz, MD    Microbiology: Recent Results (from the past 240 hour(s))  URINE CULTURE     Status: None   Collection Time    09/05/12  3:41 AM  Result Value Range Status   Specimen Description URINE, CLEAN CATCH   Final   Special Requests NONE   Final   Culture  Setup Time 09/05/2012 04:42   Final   Colony Count >=100,000 COLONIES/ML   Final   Culture     Final   Value: STAPHYLOCOCCUS SPECIES (COAGULASE NEGATIVE)     Note: RIFAMPIN AND GENTAMICIN SHOULD NOT BE USED AS SINGLE DRUGS FOR TREATMENT OF STAPH INFECTIONS.   Report Status 09/07/2012 FINAL   Final   Organism ID, Bacteria STAPHYLOCOCCUS SPECIES (COAGULASE NEGATIVE)   Final     Labs: Basic Metabolic Panel:  Recent Labs Lab 09/05/12 0845  NA 139  K 4.4  CL 101  CO2 27  GLUCOSE 207*  BUN 30*  CREATININE 1.40*  CALCIUM 9.2   Liver Function Tests:  Recent Labs Lab 09/05/12 0845  AST 13  ALT 9  ALKPHOS 93  BILITOT 0.6  PROT 6.9  ALBUMIN 3.6   No results found for this basename: LIPASE, AMYLASE,  in the last 168 hours No results found for this basename: AMMONIA,  in the last 168 hours CBC:  Recent Labs Lab 09/05/12 0845  WBC 6.1  NEUTROABS 4.3  HGB 14.5  HCT 41.6  MCV 87.2  PLT 135*   Cardiac Enzymes:  Recent Labs Lab 09/05/12 0843 09/05/12 1532  TROPONINI <0.30 <0.30   BNP: BNP (last 3 results) No results found for this basename: PROBNP,  in the last 8760 hours CBG:  Recent Labs Lab 09/09/12 0056 09/09/12 0445 09/09/12 0751 09/09/12 0912 09/09/12 1208  GLUCAP 91 88 62* 69* 147*       Signed:  Mahlet Jergens K  [facility] 09/09/2012, 12:11 PM

## 2012-09-08 NOTE — Progress Notes (Signed)
Physical Therapy Treatment Patient Details Name: [name] MRN: [id number] DOB: [birth year] Today's Date: 09/08/2012 Time: 9629-5284 PT Time Calculation (min): 14 min  PT Assessment / Plan / Recommendation  History of Present Illness s/p fall resulting in R patella fx   Clinical Impression Pt making slow progress with mobility at this date.  Becomes somewhat emotional & has concerns about how quick he will recover.  Encouraged pt to stay motivated, positive, & cont to work as hard as he can with rehab.        Follow Up Recommendations  SNF     Does the patient have the potential to tolerate intense rehabilitation     Barriers to Discharge        Equipment Recommendations       Recommendations for Other Services    Frequency Min 3X/week   Progress towards PT Goals Progress towards PT goals: Progressing toward goals  Plan Current plan remains appropriate    Precautions / Restrictions Precautions Precautions: Fall Required Braces or Orthoses: Other Brace/Splint Other Brace/Splint: hinged brace R LE; locked into extension Restrictions RLE Weight Bearing: Weight bearing as tolerated (with hinged brace)   Pertinent Vitals/Pain Pt states "Im sore all over" but did not rate.      Mobility  Bed Mobility Bed Mobility: Supine to Sit;Sitting - Scoot to Edge of Bed Supine to Sit: 3: Mod assist;HOB elevated;With rails Sitting - Scoot to Edge of Bed: 3: Mod assist Details for Bed Mobility Assistance: Max directional cues for sequencing & use of UE's to assist with transitional movements.  Pt kept reaching to grab for therapist to pull himself to sitting upright.  (A) for RLE & to lift shoulders/trunk to sitting upright.   Transfers Transfers: Sit to Stand;Stand to Sit;Stand Pivot Transfers Sit to Stand: 1: +2 Total assist;With upper extremity assist;From bed Sit to Stand: Patient Percentage: 50% Stand to Sit: 1: +2 Total assist;With upper extremity assist;With armrests;To  chair/3-in-1 Stand to Sit: Patient Percentage: 60% Stand Pivot Transfers: 1: +2 Total assist Stand Pivot Transfers: Patient Percentage: 50% Details for Transfer Assistance: Max cues for hand placement, & safe technique.  Pt leaning heavily to Lt side & would not shift weight to midline despite max cues.   Ambulation/Gait Ambulation/Gait Assistance: Not tested (comment) Stairs: No Wheelchair Mobility Wheelchair Mobility: No      PT Goals (current goals can now be found in the care plan section) Acute Rehab PT Goals Patient Stated Goal: to get better PT Goal Formulation: With patient Time For Goal Achievement: 09/13/12 Potential to Achieve Goals: Fair  Visit Information  Last PT Received On: 09/08/12 Assistance Needed: +2 History of Present Illness: s/p fall resulting in R patella fx    Subjective Data  Subjective: "good, help me up" Patient Stated Goal: to get better   Cognition  Cognition Arousal/Alertness: Awake/alert Behavior During Therapy: Flat affect Overall Cognitive Status: Within Functional Limits for tasks assessed    Balance  Balance Balance Assessed: Yes Static Standing Balance Static Standing - Balance Support: Bilateral upper extremity supported Static Standing - Level of Assistance: 1: +2 Total assist Static Standing - Comment/# of Minutes: Pt leaning heavily to Lt side.  He would not shift weight towards midline despite max cues.    End of Session PT - End of Session Equipment Utilized During Treatment: Gait belt;Other (comment) (hinged brace RLE) Activity Tolerance: Patient tolerated treatment well Patient left: in chair;with call bell/phone within reach Nurse Communication: Mobility status   GP  Lara Mulch 09/08/2012, 9:44 AM   Verdell Face, PTA [phone] 09/08/2012

## 2012-09-09 DIAGNOSIS — I25810 Atherosclerosis of coronary artery bypass graft(s) without angina pectoris: Secondary | ICD-10-CM

## 2012-09-09 DIAGNOSIS — I10 Essential (primary) hypertension: Secondary | ICD-10-CM

## 2012-09-09 LAB — GLUCOSE, CAPILLARY
Glucose-Capillary: 62 mg/dL — ABNORMAL LOW (ref 70–99)
Glucose-Capillary: 69 mg/dL — ABNORMAL LOW (ref 70–99)
Glucose-Capillary: 88 mg/dL (ref 70–99)
Glucose-Capillary: 91 mg/dL (ref 70–99)

## 2012-09-09 MED ORDER — CARVEDILOL 3.125 MG PO TABS: 3.1250 mg | ORAL_TABLET | Freq: Two times a day (BID) | ORAL | Status: AC

## 2012-09-09 MED ORDER — CARVEDILOL 3.125 MG PO TABS
3.1250 mg | ORAL_TABLET | Freq: Two times a day (BID) | ORAL | Status: DC
  Filled 2012-09-09 (×2): qty 1

## 2012-09-09 NOTE — Progress Notes (Signed)
Patient with a 5 beat run of v-tach overnight.  Vital signs stable, patient not in any distress, no chest or SOB.  Spoke with Dr. Hiram Comber on call for [place], new order for 12-lead EKG.  EKG completed, Dr. Hiram Comber will review in Epic.  Nursing will continue to monitor.

## 2012-09-09 NOTE — Progress Notes (Signed)
   Consulting cardiologist: Dr. Rollene Rotunda  Subjective:   Denies palpitations or progressive chest pain. Appetite fair.   Objective:   Temp:  [98.6 F (37 C)-99.4 F (37.4 C)] 98.6 F (37 C) (07/26 0500) Pulse Rate:  [71-75] 74 (07/26 0500) Resp:  [16-18] 16 (07/26 0800) BP: (123-148)/(55-73) 124/65 mmHg (07/26 0500) SpO2:  [90 %-96 %] 96 % (07/26 0800) Last BM Date: 09/03/12  Filed Weights   09/07/12 1300  Weight: 195 lb (88.451 kg)    Intake/Output Summary (Last 24 hours) at 09/09/12 0916 Last data filed at 09/09/12 0641  Gross per 24 hour  Intake      0 ml  Output    550 ml  Net   -550 ml   Telemetry: Sinus rhythm with PVCs, artifact.  Exam:  General: Appears comfortable at rest.  Lungs: Clear, nonlabored.  Cardiac: RRR, no gallop.  Extremities: No pitting.  Lab Results:  Basic Metabolic Panel:  Recent Labs Lab 09/05/12 0845  NA 139  K 4.4  CL 101  CO2 27  GLUCOSE 207*  BUN 30*  CREATININE 1.40*  CALCIUM 9.2    Liver Function Tests:  Recent Labs Lab 09/05/12 0845  AST 13  ALT 9  ALKPHOS 93  BILITOT 0.6  PROT 6.9  ALBUMIN 3.6    CBC:  Recent Labs Lab 09/05/12 0845  WBC 6.1  HGB 14.5  HCT 41.6  MCV 87.2  PLT 135*    Cardiac Enzymes:  Recent Labs Lab 09/05/12 0843 09/05/12 1532  TROPONINI <0.30 <0.30    Coagulation:  Recent Labs Lab 09/05/12 0845  INR 1.09    ECG: Sinus rhythm with RBBB and old IMI pattern.   Medications:   Scheduled Medications: . amitriptyline  10 mg Oral QHS  . amLODipine  10 mg Oral QHS  . aspirin  81 mg Oral Daily  . glipiZIDE  10 mg Oral BID AC  . insulin aspart  0-9 Units Subcutaneous Q4H  . isosorbide dinitrate  60 mg Oral BID  . lisinopril  20 mg Oral BID      PRN Medications:  hydrALAZINE, HYDROcodone-acetaminophen, LORazepam, LORazepam, morphine injection, nitroGLYCERIN, ondansetron (ZOFRAN) IV   Assessment:   1. CAD s/p CABG 1998 with ischemic cardiomyopathy,  LVEF 45%. Plan has been medical management. On ASA, Norvasc, Imdur, Lisinopril with chronic angina symptoms. Recent troponin I negative.  2. PVCs, possibly brief NSVT (did not see this on monitor review), asymptomatic. Not on beta blocker.  3. Hypertension.   Plan/Discussion:    Would try low dose beta blocker such as Coreg 3.125 mg BID, otherwise continue current cardiac regimen.   Jonelle Sidle, M.D., F.A.C.C.

## 2013-03-09 ENCOUNTER — Emergency Department (HOSPITAL_COMMUNITY)
Admission: EM | Admit: 2013-03-09 | Discharge: 2013-03-09 | Disposition: A | Discharge status: Home / Self Care | Payer: Medicare Other | Attending: Emergency Medicine | Admitting: Emergency Medicine

## 2013-03-09 ENCOUNTER — Encounter (HOSPITAL_COMMUNITY): Payer: Self-pay | Admitting: Emergency Medicine

## 2013-03-09 DIAGNOSIS — I2510 Atherosclerotic heart disease of native coronary artery without angina pectoris: Secondary | ICD-10-CM | POA: Insufficient documentation

## 2013-03-09 DIAGNOSIS — Z79899 Other long term (current) drug therapy: Secondary | ICD-10-CM | POA: Insufficient documentation

## 2013-03-09 DIAGNOSIS — Z8673 Personal history of transient ischemic attack (TIA), and cerebral infarction without residual deficits: Secondary | ICD-10-CM | POA: Insufficient documentation

## 2013-03-09 DIAGNOSIS — Z8546 Personal history of malignant neoplasm of prostate: Secondary | ICD-10-CM | POA: Insufficient documentation

## 2013-03-09 DIAGNOSIS — Z7982 Long term (current) use of aspirin: Secondary | ICD-10-CM | POA: Insufficient documentation

## 2013-03-09 DIAGNOSIS — I10 Essential (primary) hypertension: Secondary | ICD-10-CM | POA: Insufficient documentation

## 2013-03-09 DIAGNOSIS — E119 Type 2 diabetes mellitus without complications: Secondary | ICD-10-CM | POA: Insufficient documentation

## 2013-03-09 DIAGNOSIS — R142 Eructation: Secondary | ICD-10-CM | POA: Insufficient documentation

## 2013-03-09 DIAGNOSIS — H3530 Unspecified macular degeneration: Secondary | ICD-10-CM | POA: Insufficient documentation

## 2013-03-09 DIAGNOSIS — R319 Hematuria, unspecified: Secondary | ICD-10-CM

## 2013-03-09 DIAGNOSIS — R141 Gas pain: Secondary | ICD-10-CM | POA: Insufficient documentation

## 2013-03-09 DIAGNOSIS — Z951 Presence of aortocoronary bypass graft: Secondary | ICD-10-CM | POA: Insufficient documentation

## 2013-03-09 DIAGNOSIS — R143 Flatulence: Secondary | ICD-10-CM

## 2013-03-09 LAB — URINALYSIS, ROUTINE W REFLEX MICROSCOPIC
Bilirubin Urine: NEGATIVE
Glucose, UA: 100 mg/dL — AB
Ketones, ur: NEGATIVE mg/dL
NITRITE: NEGATIVE
Protein, ur: >300 mg/dL — AB
SPECIFIC GRAVITY, URINE: 1.017 (ref 1.005–1.030)
UROBILINOGEN UA: 1 mg/dL (ref 0.0–1.0)
pH: 7 (ref 5.0–8.0)

## 2013-03-09 LAB — CBC
HCT: 48 % (ref 39.0–52.0)
Hemoglobin: 16.2 g/dL (ref 13.0–17.0)
MCH: 29.2 pg (ref 26.0–34.0)
MCHC: 33.8 g/dL (ref 30.0–36.0)
MCV: 86.5 fL (ref 78.0–100.0)
PLATELETS: 145 10*3/uL — AB (ref 150–400)
RBC: 5.55 MIL/uL (ref 4.22–5.81)
RDW: 13.7 % (ref 11.5–15.5)
WBC: 9.5 10*3/uL (ref 4.0–10.5)

## 2013-03-09 LAB — BASIC METABOLIC PANEL
BUN: 30 mg/dL — ABNORMAL HIGH (ref 6–23)
CALCIUM: 9.4 mg/dL (ref 8.4–10.5)
CO2: 26 meq/L (ref 19–32)
CREATININE: 1.33 mg/dL (ref 0.50–1.35)
Chloride: 97 mEq/L (ref 96–112)
GFR calc Af Amer: 56 mL/min — ABNORMAL LOW (ref >90)
GFR calc non Af Amer: 48 mL/min — ABNORMAL LOW (ref >90)
Glucose, Bld: 228 mg/dL — ABNORMAL HIGH (ref 70–99)
Potassium: 4.4 mEq/L (ref 3.7–5.3)
Sodium: 138 mEq/L (ref 137–147)

## 2013-03-09 LAB — URINE MICROSCOPIC-ADD ON

## 2013-03-09 LAB — PROTIME-INR
INR: 0.98 (ref 0.00–1.49)
PROTHROMBIN TIME: 12.8 s (ref 11.6–15.2)

## 2013-03-09 MED ORDER — SODIUM CHLORIDE 0.9 % IV BOLUS (SEPSIS)
1000.0000 mL | Freq: Once | INTRAVENOUS | Status: AC
  Administered 2013-03-09: 1000 mL via INTRAVENOUS

## 2013-03-09 MED ORDER — MORPHINE SULFATE 2 MG/ML IJ SOLN
2.0000 mg | Freq: Once | INTRAMUSCULAR | Status: AC
  Administered 2013-03-09: 2 mg via INTRAVENOUS
  Filled 2013-03-09: qty 1

## 2013-03-09 MED ORDER — ONDANSETRON HCL 4 MG/2ML IJ SOLN
4.0000 mg | Freq: Once | INTRAMUSCULAR | Status: AC
  Administered 2013-03-09: 4 mg via INTRAVENOUS
  Filled 2013-03-09: qty 2

## 2013-03-09 NOTE — Discharge Instructions (Signed)
Please call your doctor for a followup appointment within 24-48 hours. When you talk to your doctor please let them know that you were seen in the emergency department and have them acquire all of your records so that they can discuss the findings with you and formulate a treatment plan to fully care for your new and ongoing problems. Please call and set-up an appointment to be re-assessed by your primary care provider.  Please call Monday morning, 03/12/2013, Dr. Arlyn Leak office to be re-assessed  Please keep Foley Catheter in at all times until seen by urology Please record amount of urine in the morning and with each emptying - record date and time Please closely monitor the urine bag - if urine stops draining or becomes bright red please report back to the ED immediately.  Please rest and stay hydrated - please drink plenty of water Please avoid NSAID (Ibuprofen, Aleve), Aspirin, and Alka-seltzer.  Please continue to monitor symptoms closely and if symptoms are to worsen or change (fever greater than 101, chills, neck pain, neck stiffness, stomach pain, inability to urinate, chest pain, shortness of breath, difficulty breathing, black stools, bright red stools, bright red blood in the urine) please report back to the ED immediately   Hematuria, Adult Hematuria is blood in your urine. It can be caused by a bladder infection, kidney infection, prostate infection, kidney stone, or cancer of your urinary tract. Infections can usually be treated with medicine, and a kidney stone usually will pass through your urine. If neither of these is the cause of your hematuria, further workup to find out the reason may be needed. It is very important that you tell your health care provider about any blood you see in your urine, even if the blood stops without treatment or happens without causing pain. Blood in your urine that happens and then stops and then happens again can be a symptom of a very serious  condition. Also, pain is not a symptom in the initial stages of many urinary cancers. HOME CARE INSTRUCTIONS   Drink lots of fluid, 3 4 quarts a day. If you have been diagnosed with an infection, cranberry juice is especially recommended, in addition to large amounts of water.  Avoid caffeine, tea, and carbonated beverages, because they tend to irritate the bladder.  Avoid alcohol because it may irritate the prostate.  Only take over-the-counter or prescription medicines for pain, discomfort, or fever as directed by your health care provider.  If you have been diagnosed with a kidney stone, follow your health care provider's instructions regarding straining your urine to catch the stone.  Empty your bladder often. Avoid holding urine for long periods of time.  After a bowel movement, women should cleanse front to back. Use each tissue only once.  Empty your bladder before and after sexual intercourse if you are a male. SEEK MEDICAL CARE IF: You develop back pain, fever, a feeling of sickness in your stomach (nausea), or vomiting or if your symptoms are not better in 3 days. Return sooner if you are getting worse. SEEK IMMEDIATE MEDICAL CARE IF:   You have a persistent fever, with a temperature of 101.74F (38.8C) or greater.  You develop severe vomiting and are unable to keep the medicine down.  You develop severe back or abdominal pain despite taking your medicines.  You begin passing a large amount of blood or clots in your urine.  You feel extremely weak or faint, or you pass out. MAKE SURE YOU:  Understand these instructions.  Will watch your condition.  Will get help right away if you are not doing well or get worse. Document Released: 02/01/2005 Document Revised: 11/22/2012 Document Reviewed: 10/02/2012 [facility] Patient Information 2014 [place], [place].  Foley Catheter Care, Adult A Foley catheter is a soft, flexible tube that is placed into the bladder to drain  urine. A Foley catheter may be inserted if:  You leak urine or are not able to control when you urinate (urinary incontinence).  You are not able to urinate when you need to (urinary retention).  You had prostate surgery or surgery on the genitals.  You have certain medical conditions, such as multiple sclerosis, dementia, or a spinal cord injury. If you are going home with a Foley catheter in place, follow the instructions below. TAKING CARE OF THE CATHETER 1. Wash your hands with soap and water. 2. Using mild soap and warm water on a clean washcloth:  Clean the area on your body closest to the catheter insertion site using a circular motion, moving away from the catheter. Never wipe toward the catheter because this could sweep bacteria up into the urethra and cause infection.  Remove all traces of soap. Pat the area dry with a clean towel. For males, reposition the foreskin. 3. Attach the catheter to your leg so there is no tension on the catheter. Use adhesive tape or a leg strap. If you are using adhesive tape, remove any sticky residue left behind by the previous tape you used. 4. Keep the drainage bag below the level of the bladder, but keep it off the floor. 5. Check throughout the day to be sure the catheter is working and urine is draining freely. Make sure the tubing does not become kinked. 6. Do not pull on the catheter or try to remove it. Pulling could damage internal tissues. TAKING CARE OF THE DRAINAGE BAGS You will be given two drainage bags to take home. One is a large overnight drainage bag, and the other is a smaller leg bag that fits underneath clothing. You may wear the overnight bag at any time, but you should never wear the smaller leg bag at night. Follow the instructions below for how to empty, change, and clean your drainage bags. Emptying the Drainage Bag You must empty your drainage bag when it is    full or at least 2 3 times a day. 1. Wash your hands with soap  and water. 2. Keep the drainage bag below your hips, below the level of your bladder. This stops urine from going back into the tubing and into your bladder. 3. Hold the dirty bag over the toilet or a clean container. 4. Open the pour spout at the bottom of the bag and empty the urine into the toilet or container. Do not let the pour spout touch the toilet, container, or any other surface. Doing so can place bacteria on the bag, which can cause an infection. 5. Clean the pour spout with a gauze pad or cotton ball that has rubbing alcohol on it. 6. Close the pour spout. 7. Attach the bag to your leg with adhesive tape or a leg strap. 8. Wash your hands well. Changing the Drainage Bag Change your drainage bag once a month or sooner if it starts to smell bad or look dirty. Below are steps to follow when changing the drainage bag. 1. Wash your hands with soap and water. 2. Pinch off the rubber catheter so that urine does not  spill out. 3. Disconnect the catheter tube from the drainage tube at the connection valve. Do not let the tubes touch any surface. 4. Clean the end of the catheter tube with an alcohol wipe. Use a different alcohol wipe to clean the end of the drainage tube. 5. Connect the catheter tube to the drainage tube of the clean drainage bag. 6. Attach the new bag to the leg with adhesive tape or a leg strap. Avoid attaching the new bag too tightly. 7. Wash your hands well. Cleaning the Drainage Bag 1. Wash your hands with soap and water. 2. Wash the bag in warm, soapy water. 3. Rinse the bag thoroughly with warm water. 4. Fill the bag with a solution of white vinegar and water (1 cup vinegar to 1 qt warm water [.2 L vinegar to 1 L warm water]). Close the bag and soak it for 30 minutes in the solution. 5. Rinse the bag with warm water. 6. Hang the bag to dry with the pour spout open and hanging downward. 7. Store the clean bag (once it is dry) in a clean plastic bag. 8. Wash your  hands well. PREVENTING INFECTION  Wash your hands before and after handling your catheter.  Take showers daily and wash the area where the catheter enters your body. Do not take baths. Replace wet leg straps with dry ones, if this applies.  Do not use powders, sprays, or lotions on the genital area. Only use creams, lotions, or ointments as directed by your caregiver.  For females, wipe from front to back after each bowel movement.  Drink enough fluids to keep your urine clear or pale yellow unless you have a fluid restriction.  Do not let the drainage bag or tubing touch or lie on the floor.  Wear cotton underwear to absorb moisture and to keep your skin drier. SEEK MEDICAL CARE IF:   Your urine is cloudy or smells unusually bad.  Your catheter becomes clogged.  You are not draining urine into the bag or your bladder feels full.  Your catheter starts to leak. SEEK IMMEDIATE MEDICAL CARE IF:   You have pain, swelling, redness, or pus where the catheter enters the body.  You have pain in the abdomen, legs, lower back, or bladder.  You have a fever.  You see blood fill the catheter, or your urine is pink or red.  You have nausea, vomiting, or chills.  Your catheter gets pulled out. MAKE SURE YOU:   Understand these instructions.  Will watch your condition.  Will get help right away if you are not doing well or get worse. Document Released: 02/01/2005 Document Revised: 05/29/2012 Document Reviewed: 01/24/2012 [facility] Patient Information 2014 [place].   Emergency Department Resource Guide 1) Find a Doctor and Pay Out of Pocket Although you won't have to find out who is covered by your insurance plan, it is a good idea to ask around and get recommendations. You will then need to call the office and see if the doctor you have chosen will accept you as a new patient and what types of options they offer for patients who are self-pay. Some doctors offer discounts or  will set up payment plans for their patients who do not have insurance, but you will need to ask so you aren't surprised when you get to your appointment.  2) Contact Your Local Health Department Not all health departments have doctors that can see patients for sick visits, but many do, so it  is worth a call to see if yours does. If you don't know where your local health department is, you can check in your phone book. The CDC also has a tool to help you locate your state's health department, and many state websites also have listings of all of their local health departments.  3) Find a [place] [facility] If your illness is not likely to be very severe or complicated, you may want to try a walk in clinic. These are popping up all over the country in pharmacies, drugstores, and shopping centers. They're usually staffed by nurse practitioners or physician assistants that have been trained to treat common illnesses and complaints. They're usually fairly quick and inexpensive. However, if you have serious medical issues or chronic medical problems, these are probably not your best option.  No Primary Care Doctor: - Call Health Connect at  [phone] - they can help you locate a primary care doctor that  accepts your insurance, provides certain services, etc. - Physician Referral Service- [phone]  Chronic Pain Problems: Organization         Address  Phone   Notes  [facility]  [phone] Patients need to be referred by their primary care doctor.   Medication Assistance: Organization         Address  Phone   Notes  Advanced Pain Management Medication [facility] [place] [address] [place], [place] [address] [phone] --Must be a resident of [facility] -- Must have NO insurance coverage whatsoever (no Medicaid/ Medicare, etc.) -- The pt. MUST have a primary care doctor that directs their care regularly and follows them in the community   Med[facility]ist  [phone]   [employer]  [phone]    Agencies that provide inexpensive medical care: Organization         Address  Phone   Notes  [place]  [phone]   [facility]    [phone]   [facility] [place], [place] [address] [phone]   [place] [address] [place] [phone]   Planned Parenthood    [phone]   [facility]    [phone]   [place] [facility] [address], [place] Phone:  [phone], Fax:  [phone] Hours of Operation:  9 am - 6 pm, M-F.  Also accepts Medicaid/Medicare and self-pay.  [facility] [place], [address], [place] Phone: [phone], Fax: [phone]. Hours of Operation:  8:30 am - 5:30 pm, M-F.  Also accepts Medicaid and self-pay.  [facility] [place] [address] [place] Phone: [phone]   [place], [place], [place] [phone], Ext. [phone] Mondays & Thursdays: 7-9 AM.  First 15 patients are seen on a first come, first serve basis.    [place] Providers:  Organization         Address  Phone   Notes  [facility] [address], [place] [phone] Also accepts self-pay patients.  [place], [place]  [phone]   [place], [address], [place] [phone]   [facility] [address], [place] [phone]   Lucianne Lei [address],    [phone] Only accepts [employer] patients after they have their name applied to their card.   Self-Pay (no insurance) in [facility]:  Organization         Address  Phone   Notes  Sickle Cell Patients, [facility] [place] [phone]   [facility] [place] [phone]   [facility] [place]  [place], [place], [place] [phone]   [facility]/Dr. Osei-Bonsu  [address], [place] or [address], [place] [phone] Phone number for both [place] and [place] locations is the same.  Urgent Medical and [facility] [address], [place] [phone]   [facility] [address], [place] or [address] [phone]   [facility] [place] [address], [place] [phone], phone; [phone], fax Sees patients 1st and 3rd Saturday of every month.  Must not qualify for public or private insurance (i.e. Medicaid, Medicare, [place] Health Choice, Veterans' Benefits)  Household income should be no more than 200% of the poverty level The clinic cannot treat you if you are pregnant or think you are pregnant  Sexually transmitted diseases are not treated at the clinic.    Dental Care: Organization         Address  Phone  Notes  [facility] Department of [facility] [place] [phone] Accepts children up to age 59 who are enrolled in [place] or [place]; pregnant women with a Medicaid card; and children who have applied for Medicaid or [place] Health Choice, but were declined, whose parents can pay a reduced fee at time of service.  [facility] Department of [facility] [place]  [address], [place] [phone] Accepts children up to age 70 who are enrolled in [place] or [place]; pregnant women with a Medicaid card; and children who have applied for Medicaid or [place] Health Choice, but were declined, whose parents can pay a reduced fee at time of service.  [place] Adult Dental Access PROGRAM  [place] [phone] Patients are seen by appointment only. Walk-ins are not accepted. [place] will see patients 55 years of age and older. Monday - Tuesday (8am-5pm) Most Wednesdays (8:30-5pm) $30 per visit, cash only  [facility] Adult Dental Access PROGRAM  [address], [place] [facility] [phone] Patients are seen by appointment only. Walk-ins are not accepted. [place] will see patients 59 years of age and older. One Wednesday Evening (Monthly: Volunteer Based).  $30 per visit, cash only  [place]  [phone] for adults; Children under age 29, call Graduate Pediatric Dentistry at [phone]. Children aged 42-14, please call [phone] to request a pediatric application.  Dental services are provided in all areas of dental care including fillings, crowns and bridges, complete and partial dentures, implants, gum treatment, root canals, and extractions. Preventive care is also provided. Treatment is provided to both adults and children. Patients are selected via a lottery and there is often a waiting list.   [facility] [address], [place]  [phone] [email or web address]   [facility] [address] [place], [place] [phone], Ext. [phone] Second and Fourth Thursday of  each month, opens at 6:30 AM; Clinic ends at 9 AM.  Patients are seen on a first-come first-served basis, and a limited number are seen during each clinic.   [facility]  [address] Hillard Danker [place], [place] [phone]   Eligibility Requirements You must have lived in [place], [place], or [place] counties for at least the last three months.   You cannot be eligible for state or federal sponsored [employer], including [employer], [place], or [employer].   You generally cannot be eligible for healthcare insurance through your employer.    How to apply: Eligibility screenings are held every Tuesday and Wednesday afternoon  from 1:00 pm until 4:00 pm. You do not need an appointment for the interview!  [facility] [address] [place], [place]   [place]  [place] Department  [place]  [phone]    Behavioral Health Resources in the Community: Intensive Outpatient Programs Organization         Address  Phone  Notes  Gardner Fraser. [address] [place], [place] [phone]   [facility] [address], [place], [place]   ADS: [facility] Drug [facility] [address], [place], [place]   [place] [address],  [place], [place] or [phone]   Substance Abuse Resources Organization         Address  Phone  Notes  Alcohol and Drug Services  [phone]   [place]  [phone]   [facility] [place]   Chinita Pester  [phone]   Residential & Outpatient Substance Abuse Program  [phone]   Psychological Services Organization         Address  Phone  Notes  [facility] [place]  [place]  [phone]   [place] [address], [place] or [phone]    Mobile Crisis Teams Organization         Address  Phone  Notes  [facility], Mobile Crisis Care Unit  [phone]   [facility]  [address] [place], [place]   [place] [address] [place] [phone]    Self-Help/Support Groups Organization         Address  Phone             Notes  [place]. of [place] - variety of support groups  [place] Call for more information  Narcotics Anonymous (NA), [facility] [address], [employer] Farmer  2 meetings at this location   Materials engineer         Address  Phone  Notes  [facility] [place],    [place]  [phone]   [facility]  [address] [place] [id number], [place], [place]   [place], [place] [phone] Admissions: 8am-3pm M-F  Incentives Substance [place] [address],    [place], [place] [phone]   [facility] [address] Jadene Pierini [place], [place]   The [place]  [place], [place] - Intensive Outpatient [address] Kristeen Mans [address], [place], [place]   [facility] ([place],  [place], [place] [phone] or [phone]   [facility] ([facility]) [address], [place], [place] Accepts Medicaid  [facility] [place] [address],  [place] [phone] Substance Abuse/Addiction Treatment   [facility] Organization         Address  Phone  Notes  [facility]  [phone]   Domenic Schwab, PhD [address] Arlis Porta [place], [place]   [phone] or [phone]   [place], [place] [phone]   [address], [place], [place] [phone] Insurance/Medicaid/sponsorship through [facility] and Families [address]                                    [place], [place] [phone] [place] [address], [place] [phone]    Dr. Adele Schilder  [phone]   [facility] Dept. 1) [address] [place] 2) [place] [place] 3)  [address], [place] [phone]  [phone]   [place] [phone] or [phone] (After Hours)

## 2013-03-09 NOTE — ED Provider Notes (Signed)
CSN: [id number]     Arrival date & time 03/09/13  1037 History   First MD Initiated Contact with Patient 03/09/13 1046     Chief Complaint  Patient presents with  . blood in urine    (Consider location/radiation/quality/duration/timing/severity/associated sxs/prior Treatment) The history is provided by the patient. No language interpreter was used.  [name] is an 80 year old male with past medical history of prostate cancer currently in remission-had radiation-remission for approximately 3 years, diabetes, hypertension, hyperlipidemia, coronary artery disease, carotid stenosis with CEA in 2008 and 2012, CABG in 1998 presenting to the emergency department with gross hematuria that started last night. Patient reported that when he normally takes aspirin he gets hematuria. Stated that he took 2 Alka-Seltzer tablets on Wednesday and reported that gross hematuria occurred last night. Stated that he has pain just prior to urination that lasts during urination and shortly after urination. Reported that he has been experiencing penile pain described as a burning sensation, sharp that is worse with urination. Stated that while he is urinating there is a constant pressure sensation to the suprapubic region without radiation. Denied chest pain, shortness of breath, difficulty breathing, nausea, vomiting, diarrhea, headache, dizziness, changes to stools, melena, hematochezia.  PCP Dr. Rosana Hoes  Past Medical History  Diagnosis Date  . Diabetes mellitus   . Hypertension   . Hyperlipidemia   . Coronary artery disease   . Cancer of prostate   . Stroke 2000  . Carotid stenosis   . Macular degeneration   . Enlarged prostate    Past Surgical History  Procedure Laterality Date  . Carotid endarterectomy  02/01/2007  . Carotid endarterectomy  03/18/2010    right  . Coronary artery bypass graft  12/24/1996    x6  . Transurethral resection of prostate  2008  . Joint replacement      Left hip   Family  History  Problem Relation Age of Onset  . Heart disease Father 71    Heart Disease before age 57  . Stroke Sister   . Diabetes Sister     x 2 with diabetes  . CAD Brother 42  . Diabetes Brother   . CAD Son 51   History  Substance Use Topics  . Smoking status: Never Smoker   . Smokeless tobacco: Never Used  . Alcohol Use: No    Review of Systems  Constitutional: Negative for fever and chills.  Respiratory: Negative for chest tightness and shortness of breath.   Cardiovascular: Negative for chest pain.  Gastrointestinal: Negative for nausea, vomiting and abdominal pain.  Genitourinary: Positive for hematuria and penile pain. Negative for testicular pain.  Musculoskeletal: Negative for back pain.  Neurological: Negative for dizziness and weakness.  All other systems reviewed and are negative.    Allergies  Lipitor; Reglan; Zocor; Codeine; Baycol; and Metoclopramide hcl  Home Medications   Current Outpatient Rx  Name  Route  Sig  Dispense  Refill  . acetaminophen (TYLENOL) 325 MG tablet   Oral   Take 650 mg by mouth every 6 (six) hours as needed.         Marland Kitchen amitriptyline (ELAVIL) 10 MG tablet   Oral   Take 10 mg by mouth at bedtime.         Marland Kitchen amLODipine (NORVASC) 10 MG tablet   Oral   Take 10 mg by mouth at bedtime.           Marland Kitchen aspirin EC 81 MG tablet   Oral  Take 81 mg by mouth daily.         Marland Kitchen aspirin-sod bicarb-citric acid (ALKA-SELTZER) 325 MG TBEF tablet   Oral   Take 325 mg by mouth every 6 (six) hours as needed.         . carvedilol (COREG) 3.125 MG tablet   Oral   Take 1 tablet (3.125 mg total) by mouth 2 (two) times daily with a meal.   60 tablet   0   . furosemide (LASIX) 20 MG tablet   Oral   Take 20 mg by mouth.         Marland Kitchen glipiZIDE (GLUCOTROL) 10 MG tablet   Oral   Take 10 mg by mouth 2 (two) times daily before a meal.           . isosorbide dinitrate (ISORDIL) 30 MG tablet   Oral   Take 30 mg by mouth. Take 2 tabs bid           . lisinopril (PRINIVIL,ZESTRIL) 20 MG tablet   Oral   Take 20 mg by mouth 2 (two) times daily.           Marland Kitchen LORazepam (ATIVAN) 1 MG tablet   Oral   Take 0.5-1 mg by mouth every 6 (six) hours as needed for anxiety.         . Melatonin 10 MG TABS   Oral   Take 1 tablet by mouth daily.         . nitroGLYCERIN (NITROSTAT) 0.4 MG SL tablet   Sublingual   Place 1 tablet (0.4 mg total) under the tongue every 5 (five) minutes as needed.   25 tablet   2   . oxyCODONE-acetaminophen (PERCOCET/ROXICET) 5-325 MG per tablet   Oral   Take 1 tablet by mouth every 4 (four) hours as needed for moderate pain.   15 tablet   0     Dispense as written.    BP 183/76  Pulse 73  Temp(Src) 98.5 F (36.9 C) (Oral)  Resp 18  SpO2 96% Physical Exam  Nursing note and vitals reviewed. Constitutional: He is oriented to person, place, and time. He appears well-developed and well-nourished. No distress.  HENT:  Head: Normocephalic and atraumatic.  Eyes: Conjunctivae and EOM are normal. Right eye exhibits no discharge. Left eye exhibits no discharge.  Neck: Normal range of motion. Neck supple.  Cardiovascular: Normal rate, regular rhythm and normal heart sounds.  Exam reveals no friction rub.   No murmur heard. Pulses:      Radial pulses are 2+ on the right side, and 2+ on the left side.       Dorsalis pedis pulses are 2+ on the right side, and 2+ on the left side.  Pulmonary/Chest: Effort normal and breath sounds normal. No respiratory distress. He has no wheezes. He has no rales.  Abdominal: Soft. Bowel sounds are normal. He exhibits no distension. There is tenderness. There is no guarding.  Abdomen mildly distended Soft upon palpation Discomfort upon palpation to the suprapubic region  Genitourinary:  Negative swelling, erythema, inflammation, lesions, sores noted to the penis. Negative inguinal lymphadenopathy. Bright red blood draining from the urethra.  Musculoskeletal: Normal  range of motion.  Full ROM to upper and lower extremities without difficulty noted, negative ataxia noted.  Neurological: He is alert and oriented to person, place, and time. He exhibits normal muscle tone. Coordination normal.  Skin: Skin is warm and dry. No rash noted. He is not diaphoretic. No  erythema.  Psychiatric: He has a normal mood and affect. His behavior is normal. Thought content normal.    ED Course  Procedures (including critical care time)  12:19 PM This provider spoke with Dr. Janice Norrie, urologist - urologist to come and assess patient.  1:55 PM Urologist PA-C in room assessing patient and performing irrigation.   3:55 PM This provider re-assessed the patient. Patient reported that he is feeling better. Approximately 130 cc's of urine in foley at the moment mainly of very lite blood tinged urine.   Results for orders placed during the hospital encounter of 03/09/13  URINALYSIS, ROUTINE W REFLEX MICROSCOPIC      Result Value Range   Color, Urine YELLOW  YELLOW   APPearance CLOUDY (*) CLEAR   Specific Gravity, Urine 1.017  1.005 - 1.030   pH 7.0  5.0 - 8.0   Glucose, UA 100 (*) NEGATIVE mg/dL   Hgb urine dipstick LARGE (*) NEGATIVE   Bilirubin Urine NEGATIVE  NEGATIVE   Ketones, ur NEGATIVE  NEGATIVE mg/dL   Protein, ur >300 (*) NEGATIVE mg/dL   Urobilinogen, UA 1.0  0.0 - 1.0 mg/dL   Nitrite NEGATIVE  NEGATIVE   Leukocytes, UA MODERATE (*) NEGATIVE  PROTIME-INR      Result Value Range   Prothrombin Time 12.8  11.6 - 15.2 seconds   INR 0.98  0.00 - 99991111  BASIC METABOLIC PANEL      Result Value Range   Sodium 138  137 - 147 mEq/L   Potassium 4.4  3.7 - 5.3 mEq/L   Chloride 97  96 - 112 mEq/L   CO2 26  19 - 32 mEq/L   Glucose, Bld 228 (*) 70 - 99 mg/dL   BUN 30 (*) 6 - 23 mg/dL   Creatinine, Ser 1.33  0.50 - 1.35 mg/dL   Calcium 9.4  8.4 - 10.5 mg/dL   GFR calc non Af Amer 48 (*) >90 mL/min   GFR calc Af Amer 56 (*) >90 mL/min  CBC      Result Value Range   WBC  9.5  4.0 - 10.5 K/uL   RBC 5.55  4.22 - 5.81 MIL/uL   Hemoglobin 16.2  13.0 - 17.0 g/dL   HCT 48.0  39.0 - 52.0 %   MCV 86.5  78.0 - 100.0 fL   MCH 29.2  26.0 - 34.0 pg   MCHC 33.8  30.0 - 36.0 g/dL   RDW 13.7  11.5 - 15.5 %   Platelets 145 (*) 150 - 400 K/uL  URINE MICROSCOPIC-ADD ON      Result Value Range   RBC / HPF TOO NUMEROUS TO COUNT  <3 RBC/hpf   Urine-Other FIELD OBSCURED BY RBC'S     Labs Review Labs Reviewed  URINALYSIS, ROUTINE W REFLEX MICROSCOPIC - Abnormal; Notable for the following:    APPearance CLOUDY (*)    Glucose, UA 100 (*)    Hgb urine dipstick LARGE (*)    Protein, ur >300 (*)    Leukocytes, UA MODERATE (*)    All other components within normal limits  BASIC METABOLIC PANEL - Abnormal; Notable for the following:    Glucose, Bld 228 (*)    BUN 30 (*)    GFR calc non Af Amer 48 (*)    GFR calc Af Amer 56 (*)    All other components within normal limits  CBC - Abnormal; Notable for the following:    Platelets 145 (*)    All  other components within normal limits  PROTIME-INR  URINE MICROSCOPIC-ADD ON   Imaging Review No results found.  EKG Interpretation   None       MDM   1. Hematuria   2. History of prostate cancer     Medications  morphine 2 MG/ML injection 2 mg (2 mg Intravenous Given 03/09/13 1243)  ondansetron (ZOFRAN) injection 4 mg (4 mg Intravenous Given 03/09/13 1243)  sodium chloride 0.9 % bolus 1,000 mL (0 mLs Intravenous Stopped 03/09/13 1417)   Filed Vitals:   03/09/13 1303 03/09/13 1436 03/09/13 1632 03/09/13 1640  BP:   154/69 183/76  Pulse:  80 73   Temp:   98.2 F (36.8 C) 98.5 F (36.9 C)  TempSrc:   Oral Oral  Resp:  16 18   SpO2: 96% 97% 96%     Patient presenting to emergency department with gross hematuria that started last night. Patient has history of prostate cancer, currently in remission for the past 3 years-was seen and assessed by Dr. Elyse Jarvis- patient has radiation exposure. Reported pain shortly  before, during and after urination. Stated that with each urination he has been having gross hematuria. Alert and oriented. GCS 15. Heart rate and rhythm normal. Lungs clear to auscultation bilaterally. Cap refill less than 3 seconds. Radial and DP pulses 2+ bilaterally. Bowel sounds normoactive in all 4 quadrants. Abdomen noted mild distention-soft upon palpation. Discomfort upon palpation to suprapubic region. Penile exam unremarkable-bright red blood noted to urethra, draining from urethra. Patient seen and assessed by Urology in ED setting. Bladder flushed with 3 L of fluid. Numerous blood clots removed from the bladder until clear urine identified. PT and INR negative findings. BMP mild elevation of BUN-BUN 30, when compared to 6 months ago BUN level has remained the same. Negative elevation of creatinine. CBC negative findings. Urinalysis noted large amount of red blood cells-large hemoglobin. Patient re-assessed - at 3:55 PM this provider checked up on patient and approximately 130 cc's of urine in foley. Patient reported that he is feeling better.  This provider read the urology consult note - patient was recommended to be admitted to the hospital for observation - patient did not want to and declined. Urology cleared patient for discharge. Patient to keep Foley catheter in and follow-up with Urology within one week.  Patient stable, afebrile. Proper kidney function. Patient had numerous blood clots in bladder that led to decrease in urination and pain with urination - as well as hematuria. Patient cleared by Urology for discharge. Patient discharged. Discussed with patient to keep Foley in. Discussed with patient that he is due to follow-up with Urology within one week. Instructed patient to drink plenty of water and to stay away from NSAIDs, aspirin, Alka-Seltzer. Discussed with patient that if urine output decreases or urine turns bloody that he needs to return to the ED. Discussed with patient to  closely monitor symptoms and if symptoms are to worsen or change to report back to the ED - strict return instructions given.  Patient agreed to plan of care, understood, all questions answered.   Jamse Mead, PA-C 03/10/13 1723

## 2013-03-09 NOTE — ED Notes (Signed)
Urologist at bedside.

## 2013-03-09 NOTE — ED Notes (Signed)
Covil, tech attempted to place foley catheter. Morning Halberg, rn witnessed. Pt reported pain upon foley insertion, no resistance reported by covil, but no urine return. Blood noted around foley tube. Per PA foley removed

## 2013-03-09 NOTE — ED Notes (Signed)
Urology requesting irrigation kit and NS at bedside. Will be done shortly to irrigate bladder

## 2013-03-09 NOTE — ED Notes (Signed)
Pt escorted to discharge window. Pt verbalized understanding discharge instructions. In no acute distress.  

## 2013-03-09 NOTE — ED Notes (Signed)
Urology at bedside, over 45 minutes of irrigation performed. 3 L NS used for irrigation

## 2013-03-09 NOTE — Consult Note (Signed)
Urology Consult   Physician requesting consult: Lacretia Leigh MD   Reason for consult: Gross Hematuria  History of Present Illness: [name] is a 80 y.o. male with PMH significant for DM, HTN, CAD, prostate cancer s/p radiation, and radiation cystitis who presented to the ED today with c/o gross hematuria.  This began last night and has persisted into today.  He has noted some clots and has burning with urination.  He had two episodes of incontinence and has continued to have bloody urine "leaking" in small amounts today, however, he states he feels as though he is emptying when he voids.  He denies F/C, N/V, and abdominal pain.  He also denies CP and SOB.    Pt has a known hx of radiation cystitis and intermittent episodes of gross hematuria.  This usually occurs after he takes ASA, alka-seltzer, or Plavix.  He states he took Alka-seltzer 3 days ago.    RN in ED performed PVR which revealed 75cc.  A 51f cath was placed with no urine return and RN pulled cath.  A large clot was noted at end of cath.  UA shows only hematuria, WBC, H/H, and Cr are all WNL.  Past Medical History  Diagnosis Date  . Diabetes mellitus   . Hypertension   . Hyperlipidemia   . Coronary artery disease   . Cancer of prostate   . Stroke 2000  . Carotid stenosis     Past Surgical History  Procedure Laterality Date  . Carotid endarterectomy  02/01/2007  . Carotid endarterectomy  03/18/2010    right  . Coronary artery bypass graft  12/24/1996    x6  . Transurethral resection of prostate  2008  . Joint replacement      Left hip    Current Hospital Medications:  Home Meds:    Medication List    ASK your doctor about these medications       acetaminophen 325 MG tablet  Commonly known as:  TYLENOL  Take 650 mg by mouth every 6 (six) hours as needed.     amitriptyline 10 MG tablet  Commonly known as:  ELAVIL  Take 10 mg by mouth at bedtime.     amLODipine 10 MG tablet  Commonly known as:   NORVASC  Take 10 mg by mouth at bedtime.     aspirin EC 81 MG tablet  Take 81 mg by mouth daily.     aspirin-sod bicarb-citric acid 325 MG Tbef tablet  Commonly known as:  ALKA-SELTZER  Take 325 mg by mouth every 6 (six) hours as needed.     carvedilol 3.125 MG tablet  Commonly known as:  COREG  Take 1 tablet (3.125 mg total) by mouth 2 (two) times daily with a meal.     furosemide 20 MG tablet  Commonly known as:  LASIX  Take 20 mg by mouth.     glipiZIDE 10 MG tablet  Commonly known as:  GLUCOTROL  Take 10 mg by mouth 2 (two) times daily before a meal.     isosorbide dinitrate 30 MG tablet  Commonly known as:  ISORDIL  Take 30 mg by mouth. Take 2 tabs bid     lisinopril 20 MG tablet  Commonly known as:  PRINIVIL,ZESTRIL  Take 20 mg by mouth 2 (two) times daily.     LORazepam 1 MG tablet  Commonly known as:  ATIVAN  Take 0.5-1 mg by mouth every 6 (six) hours as needed for anxiety.  Melatonin 10 MG Tabs  Take 1 tablet by mouth daily.     nitroGLYCERIN 0.4 MG SL tablet  Commonly known as:  NITROSTAT  Place 1 tablet (0.4 mg total) under the tongue every 5 (five) minutes as needed.        Scheduled Meds: Continuous Infusions: PRN Meds:.    Allergies:  Allergies  Allergen Reactions  . Lipitor [Atorvastatin] Other (See Comments)    GI  And  " pt could not talk"  . Reglan [Metoclopramide] Other (See Comments)    Trimmers " pt can't talk"  . Zocor [Simvastatin]     GI  . Codeine Nausea And Vomiting  . Baycol [Cerivastatin]   . Metoclopramide Hcl     Family History  Problem Relation Age of Onset  . Heart disease Father 79    Heart Disease before age 69  . Stroke Sister   . Diabetes Sister     x 2 with diabetes  . CAD Brother 74  . Diabetes Brother   . CAD Son 20    Social History:  reports that he has never smoked. He has never used smokeless tobacco. He reports that he does not drink alcohol or use illicit drugs.  ROS: A complete review of  systems was performed.  All systems are negative except for pertinent findings as noted.  Physical Exam:  Vital signs in last 24 hours: Temp:  [97.7 F (36.5 C)] 97.7 F (36.5 C) (01/23 1051) Pulse Rate:  [75-87] 80 (01/23 1436) Resp:  [16] 16 (01/23 1436) BP: (164-195)/(76-81) 164/81 mmHg (01/23 1257) SpO2:  [88 %-97 %] 97 % (01/23 1436) Constitutional:  Alert and oriented, No acute distress Cardiovascular: Regular rate and rhythm Respiratory: Normal respiratory effort GI: Abdomen is soft, mild suprapubic tenderness, nondistended, no abdominal masses GU: No CVA tenderness; uncircum penis, small amount bloody urine noted leaking from meatus; no penile skin breakdown or other discharge Lymphatic: No lymphadenopathy Neurologic: Grossly intact, no focal deficits Psychiatric: Normal mood and affect  Laboratory Data:   Recent Labs  03/09/13 1150  WBC 9.5  HGB 16.2  HCT 48.0  PLT 145*     Recent Labs  03/09/13 1150  NA 138  K 4.4  CL 97  GLUCOSE 228*  BUN 30*  CALCIUM 9.4  CREATININE 1.33     Results for orders placed during the hospital encounter of 03/09/13 (from the past 24 hour(s))  URINALYSIS, ROUTINE W REFLEX MICROSCOPIC     Status: Abnormal   Collection Time    03/09/13 11:39 AM      Result Value Range   Color, Urine YELLOW  YELLOW   APPearance CLOUDY (*) CLEAR   Specific Gravity, Urine 1.017  1.005 - 1.030   pH 7.0  5.0 - 8.0   Glucose, UA 100 (*) NEGATIVE mg/dL   Hgb urine dipstick LARGE (*) NEGATIVE   Bilirubin Urine NEGATIVE  NEGATIVE   Ketones, ur NEGATIVE  NEGATIVE mg/dL   Protein, ur >300 (*) NEGATIVE mg/dL   Urobilinogen, UA 1.0  0.0 - 1.0 mg/dL   Nitrite NEGATIVE  NEGATIVE   Leukocytes, UA MODERATE (*) NEGATIVE  URINE MICROSCOPIC-ADD ON     Status: None   Collection Time    03/09/13 11:39 AM      Result Value Range   RBC / HPF TOO NUMEROUS TO COUNT  <3 RBC/hpf   Urine-Other FIELD OBSCURED BY RBC'S    PROTIME-INR     Status: None    Collection Time  03/09/13 11:50 AM      Result Value Range   Prothrombin Time 12.8  11.6 - 15.2 seconds   INR 0.98  0.00 - 99991111  BASIC METABOLIC PANEL     Status: Abnormal   Collection Time    03/09/13 11:50 AM      Result Value Range   Sodium 138  137 - 147 mEq/L   Potassium 4.4  3.7 - 5.3 mEq/L   Chloride 97  96 - 112 mEq/L   CO2 26  19 - 32 mEq/L   Glucose, Bld 228 (*) 70 - 99 mg/dL   BUN 30 (*) 6 - 23 mg/dL   Creatinine, Ser 1.33  0.50 - 1.35 mg/dL   Calcium 9.4  8.4 - 10.5 mg/dL   GFR calc non Af Amer 48 (*) >90 mL/min   GFR calc Af Amer 56 (*) >90 mL/min  CBC     Status: Abnormal   Collection Time    03/09/13 11:50 AM      Result Value Range   WBC 9.5  4.0 - 10.5 K/uL   RBC 5.55  4.22 - 5.81 MIL/uL   Hemoglobin 16.2  13.0 - 17.0 g/dL   HCT 48.0  39.0 - 52.0 %   MCV 86.5  78.0 - 100.0 fL   MCH 29.2  26.0 - 34.0 pg   MCHC 33.8  30.0 - 36.0 g/dL   RDW 13.7  11.5 - 15.5 %   Platelets 145 (*) 150 - 400 K/uL   No results found for this or any previous visit (from the past 240 hour(s)).  Renal Function:  Recent Labs  03/09/13 1150  CREATININE 1.33    Radiologic Imaging: No results found.  Procedure: Area was prepped and draped in a sterile fashion.  81f cath was placed without difficulty and balloon inflated with 30cc.  Immediate return of 200cc bright red bloody urine.  45 minutes spent irrigating cath by hand with 3.5L sterile saline.  Copious amount of clot removed.  Irrigate was clear an end of procedure.  Pt tolerated well.  Foley left in place and attached to large cath bag.    Impression/Recommendation Gross hematuria--Leave foley in place with void trial in our office next week.  Gave pt and daughter the option of being admitted overnight for observation and repeat cath irrigation if needed vs going home monitoring urine output.  They both wished to go home.  He and his daughter were educated on monitoring urine output and instructed to return to the ED if  output declined as his cath may be clotted off.  He was also instructed to drink plenty of water while his cath was in place and to avoid alka-seltzer, ASA, and NSAIDs until f/u in our office.  They both voiced understanding and agreed with plan.    Discussed case and findings with Dr. Gaynelle Arabian who agreed with plan.      Marcie Bal 03/09/2013, 3:10 PM

## 2013-03-09 NOTE — ED Notes (Signed)
PA at bedside.

## 2013-03-09 NOTE — ED Notes (Signed)
Pt reports hx of prostate cancer, not currently taking chemo or radiation. Pt reports that occasionally he has blood in his urine. Reports urinating only blood x1 day, penile pain 10/10. In past if pt takes aspirin pt may or may not have blood in urine. Pt took some aspirin 2 days ago. Urine bright red.

## 2013-03-09 NOTE — ED Notes (Signed)
Per PA pt allowed to have PO fluids 

## 2013-03-09 NOTE — ED Notes (Signed)
Pt alert and oriented x4. Respirations even and unlabored, bilateral symmetrical rise and fall of chest. Skin warm and dry. In no acute distress. Denies needs.   

## 2013-03-10 ENCOUNTER — Encounter (HOSPITAL_COMMUNITY): Payer: Self-pay | Admitting: Emergency Medicine

## 2013-03-10 ENCOUNTER — Emergency Department (HOSPITAL_COMMUNITY)
Admission: EM | Admit: 2013-03-10 | Discharge: 2013-03-10 | Disposition: A | Discharge status: Home / Self Care | Payer: Medicare Other | Attending: Emergency Medicine | Admitting: Emergency Medicine

## 2013-03-10 DIAGNOSIS — Z7982 Long term (current) use of aspirin: Secondary | ICD-10-CM | POA: Insufficient documentation

## 2013-03-10 DIAGNOSIS — E119 Type 2 diabetes mellitus without complications: Secondary | ICD-10-CM | POA: Insufficient documentation

## 2013-03-10 DIAGNOSIS — T83091A Other mechanical complication of indwelling urethral catheter, initial encounter: Secondary | ICD-10-CM | POA: Insufficient documentation

## 2013-03-10 DIAGNOSIS — Z8546 Personal history of malignant neoplasm of prostate: Secondary | ICD-10-CM | POA: Insufficient documentation

## 2013-03-10 DIAGNOSIS — Z79899 Other long term (current) drug therapy: Secondary | ICD-10-CM | POA: Insufficient documentation

## 2013-03-10 DIAGNOSIS — Z8669 Personal history of other diseases of the nervous system and sense organs: Secondary | ICD-10-CM | POA: Insufficient documentation

## 2013-03-10 DIAGNOSIS — I2510 Atherosclerotic heart disease of native coronary artery without angina pectoris: Secondary | ICD-10-CM | POA: Insufficient documentation

## 2013-03-10 DIAGNOSIS — I10 Essential (primary) hypertension: Secondary | ICD-10-CM | POA: Insufficient documentation

## 2013-03-10 DIAGNOSIS — Z951 Presence of aortocoronary bypass graft: Secondary | ICD-10-CM | POA: Insufficient documentation

## 2013-03-10 DIAGNOSIS — Z8673 Personal history of transient ischemic attack (TIA), and cerebral infarction without residual deficits: Secondary | ICD-10-CM | POA: Insufficient documentation

## 2013-03-10 DIAGNOSIS — Z87448 Personal history of other diseases of urinary system: Secondary | ICD-10-CM | POA: Insufficient documentation

## 2013-03-10 DIAGNOSIS — Y846 Urinary catheterization as the cause of abnormal reaction of the patient, or of later complication, without mention of misadventure at the time of the procedure: Secondary | ICD-10-CM | POA: Insufficient documentation

## 2013-03-10 DIAGNOSIS — T839XXA Unspecified complication of genitourinary prosthetic device, implant and graft, initial encounter: Secondary | ICD-10-CM

## 2013-03-10 HISTORY — DX: Benign prostatic hyperplasia without lower urinary tract symptoms: N40.0

## 2013-03-10 HISTORY — DX: Unspecified macular degeneration: H35.30

## 2013-03-10 MED ORDER — OXYCODONE-ACETAMINOPHEN 5-325 MG PO TABS: 1.0000 | ORAL_TABLET | ORAL | Status: AC | PRN

## 2013-03-10 MED ORDER — OXYCODONE-ACETAMINOPHEN 5-325 MG PO TABS
1.0000 | ORAL_TABLET | ORAL | Status: DC | PRN
Start: 2013-03-10 — End: 2013-03-10
  Administered 2013-03-10: 1 via ORAL
  Filled 2013-03-10: qty 1

## 2013-03-10 NOTE — Discharge Instructions (Signed)
Please followup with urology as scheduled.  If you're having lower abdominal or bladder pain, please return to the emergency department for evaluation.  Do not attempt to manipulate your catheter yourself.

## 2013-03-10 NOTE — ED Notes (Signed)
Upon assessment of the pt, catheter tip could be felt internally in the penile shaft. Was able to extract catheter with minimal discomfort to the pt. MD made aware.

## 2013-03-10 NOTE — ED Provider Notes (Signed)
CSN: [id number]     Arrival date & time 03/10/13  0319 History   First MD Initiated Contact with Patient 03/10/13 0325     Chief Complaint  Patient presents with  . Foreign Body    foley catheter   (Consider location/radiation/quality/duration/timing/severity/associated sxs/prior Treatment) HPI 80 year old male seen earlier today for hematuria, reports that he was having increasing pain in his bladder.  Patient tried to adjust his Foley catheter, and then asked his wife to cut the catheter as the pain was not improving.  Patient had bladder irrigation done by urology here in the emergency room in earlier, and it was recommended that he be admitted for which he refused.  Patient reports after his wife cut the Foley catheter, it retracted into his penis.  He reports he did have a stream of urine and is no longer having severe bladder pain.  Nursing staff here able to recover at the remaining catheter.  He denies any fever, chills, nausea, vomiting, or diarrhea.  He has history of prostate cancer.  He is planning to followup with urology on Monday in the office. Past Medical History  Diagnosis Date  . Diabetes mellitus   . Hypertension   . Hyperlipidemia   . Coronary artery disease   . Cancer of prostate   . Stroke 2000  . Carotid stenosis   . Macular degeneration   . Enlarged prostate    Past Surgical History  Procedure Laterality Date  . Carotid endarterectomy  02/01/2007  . Carotid endarterectomy  03/18/2010    right  . Coronary artery bypass graft  12/24/1996    x6  . Transurethral resection of prostate  2008  . Joint replacement      Left hip   Family History  Problem Relation Age of Onset  . Heart disease Father 19    Heart Disease before age 70  . Stroke Sister   . Diabetes Sister     x 2 with diabetes  . CAD Brother 46  . Diabetes Brother   . CAD Son 15   History  Substance Use Topics  . Smoking status: Never Smoker   . Smokeless tobacco: Never Used  . Alcohol Use:  No    Review of Systems  See History of Present Illness; otherwise all other systems are reviewed and negative Allergies  Lipitor; Reglan; Zocor; Codeine; Baycol; and Metoclopramide hcl  Home Medications   Current Outpatient Rx  Name  Route  Sig  Dispense  Refill  . acetaminophen (TYLENOL) 325 MG tablet   Oral   Take 650 mg by mouth every 6 (six) hours as needed.         Marland Kitchen amitriptyline (ELAVIL) 10 MG tablet   Oral   Take 10 mg by mouth at bedtime.         Marland Kitchen amLODipine (NORVASC) 10 MG tablet   Oral   Take 10 mg by mouth at bedtime.           Marland Kitchen aspirin EC 81 MG tablet   Oral   Take 81 mg by mouth daily.         Marland Kitchen aspirin-sod bicarb-citric acid (ALKA-SELTZER) 325 MG TBEF tablet   Oral   Take 325 mg by mouth every 6 (six) hours as needed.         . carvedilol (COREG) 3.125 MG tablet   Oral   Take 1 tablet (3.125 mg total) by mouth 2 (two) times daily with a meal.   60 tablet  0   . furosemide (LASIX) 20 MG tablet   Oral   Take 20 mg by mouth.         Marland Kitchen glipiZIDE (GLUCOTROL) 10 MG tablet   Oral   Take 10 mg by mouth 2 (two) times daily before a meal.           . isosorbide dinitrate (ISORDIL) 30 MG tablet   Oral   Take 30 mg by mouth. Take 2 tabs bid          . lisinopril (PRINIVIL,ZESTRIL) 20 MG tablet   Oral   Take 20 mg by mouth 2 (two) times daily.           Marland Kitchen LORazepam (ATIVAN) 1 MG tablet   Oral   Take 0.5-1 mg by mouth every 6 (six) hours as needed for anxiety.         . Melatonin 10 MG TABS   Oral   Take 1 tablet by mouth daily.         . nitroGLYCERIN (NITROSTAT) 0.4 MG SL tablet   Sublingual   Place 1 tablet (0.4 mg total) under the tongue every 5 (five) minutes as needed.   25 tablet   2    BP 172/66  Pulse 69  Temp(Src) 98.7 F (37.1 C) (Oral)  Resp 22  SpO2 97% Physical Exam  Nursing note and vitals reviewed. Constitutional: He is oriented to person, place, and time. He appears well-developed and  well-nourished. No distress.  HENT:  Head: Normocephalic and atraumatic.  Nose: Nose normal.  Mouth/Throat: Oropharynx is clear and moist.  Eyes: Conjunctivae and EOM are normal. Pupils are equal, round, and reactive to light.  Neck: Normal range of motion. Neck supple. No JVD present. No tracheal deviation present. No thyromegaly present.  Cardiovascular: Normal rate, regular rhythm, normal heart sounds and intact distal pulses.  Exam reveals no gallop and no friction rub.   No murmur heard. Pulmonary/Chest: Effort normal and breath sounds normal. No stridor. No respiratory distress. He has no wheezes. He has no rales. He exhibits no tenderness.  Abdominal: Soft. Bowel sounds are normal. He exhibits no distension and no mass. There is no tenderness. There is no rebound and no guarding.  Genitourinary:  Blood noted at urethral meatus  Musculoskeletal: Normal range of motion. He exhibits no edema and no tenderness.  Lymphadenopathy:    He has no cervical adenopathy.  Neurological: He is alert and oriented to person, place, and time. He exhibits normal muscle tone. Coordination normal.  Skin: Skin is warm and dry. No rash noted. No erythema. No pallor.  Psychiatric: He has a normal mood and affect. His behavior is normal. Judgment and thought content normal.    ED Course  Procedures (including critical care time) Labs Review Labs Reviewed - No data to display Imaging Review No results found.  EKG Interpretation   None       MDM   1. Foley catheter problem    80 year old male with hematuria with clots, with subsequent suprapubic pain for which he cut his Foley catheter.  Plan to replace the catheter with a three-way, and patient have irrigation of the bladder again, here tonight.  Patient has been strongly advised that he should be admitted to the hospital, but he is refusing this outright.  Patient acknowledges that he is not to cut his catheter, and will return to the emergency  room for any worsening problems.    Kalman Drape, MD 03/10/13 [phone]

## 2013-03-10 NOTE — ED Notes (Signed)
Wife of patient [name]) called to state she can not take of pt and we can keep him (referring to hospital). RN asked about daughter coming, "she has feet problems" and can not come. She went on to say if he is unable to care for himself, he is your problem.(again referring to the hospital) RN explained the need to involve APS if she is not going to allow him to come home. "Do what you have to." RN proceeded to speak with pt whom states he cares for himself at home. He feels fine about discharge if someone can transport him home. RN tried to call wife and let her know we have [facility] to transport him home. The line is continuously busy. RN has been trying to communicate with wife for the last 40 minutes.  Deputy Hendrix with [facility] has been notified of situation and will be glad to meet transport to assist with getting pt into his home.

## 2013-03-10 NOTE — ED Notes (Signed)
Attempted to call wife and "hire hand" to pick self up and take home. After no one being agreeable to come, pt states that he will just wait in the waiting room until daughter comes and gets him later this morning. Pt is stable upon time of discharge.

## 2013-03-10 NOTE — ED Notes (Signed)
Spoke with brother Delmar who is coming to transport pt home. Wife did call back, she is aware pt is coming home. She states "you can not believe what he says, you should have made sure he could change that catherter"  RN made wife aware that if he needs to return due to cath problems or other health concerns he is welcome to return.

## 2013-03-10 NOTE — ED Notes (Signed)
Bed: HU76 Expected date: 03/10/13 Expected time: 3:05 AM Means of arrival: Ambulance Comments: Bed 12, EMS, 83M, Foley Cath Retraction

## 2013-03-10 NOTE — ED Notes (Signed)
Pt had foley placed at in the ED yesterday was discharged home with catheter. Pt awoke in the night with pain related to catheter and had wife cut the catheter as which time it retracted into the bladder per pt and family. While in the ED it is reported by patient that he was having clots and therefore flushed the bladder prior to discharge. Pt hasn't voided since cutting the catheter.

## 2013-03-13 NOTE — Consult Note (Signed)
Case discussed with PA. I agree with evaluation and plan.

## 2013-03-15 ENCOUNTER — Other Ambulatory Visit: Payer: Self-pay | Admitting: Neurosurgery

## 2013-03-15 DIAGNOSIS — I6529 Occlusion and stenosis of unspecified carotid artery: Secondary | ICD-10-CM

## 2013-03-15 DIAGNOSIS — Z48812 Encounter for surgical aftercare following surgery on the circulatory system: Secondary | ICD-10-CM

## 2013-03-16 NOTE — ED Provider Notes (Signed)
Medical screening examination/treatment/procedure(s) were performed by non-physician practitioner and as supervising physician I was immediately available for consultation/collaboration.  Javonne Dorko T Rapheal Masso, MD 03/16/13 0933 

## 2013-04-16 ENCOUNTER — Ambulatory Visit: Payer: Medicare Other | Admitting: Neurosurgery

## 2013-04-16 ENCOUNTER — Other Ambulatory Visit: Payer: Medicare Other

## 2013-04-17 ENCOUNTER — Ambulatory Visit (HOSPITAL_COMMUNITY)
Admission: RE | Admit: 2013-04-17 | Discharge: 2013-04-17 | Disposition: A | Discharge status: Home / Self Care | Payer: Medicare Other | Source: Ambulatory Visit | Attending: Vascular Surgery | Admitting: Vascular Surgery

## 2013-04-17 ENCOUNTER — Ambulatory Visit: Payer: Medicare Other | Admitting: Neurosurgery

## 2013-04-17 ENCOUNTER — Ambulatory Visit: Payer: Medicare Other | Admitting: Family

## 2013-04-17 ENCOUNTER — Other Ambulatory Visit: Payer: Medicare Other

## 2013-04-17 DIAGNOSIS — I6529 Occlusion and stenosis of unspecified carotid artery: Secondary | ICD-10-CM

## 2013-04-17 DIAGNOSIS — Z48812 Encounter for surgical aftercare following surgery on the circulatory system: Secondary | ICD-10-CM

## 2013-04-17 DIAGNOSIS — Z9889 Other specified postprocedural states: Secondary | ICD-10-CM | POA: Insufficient documentation

## 2013-05-15 ENCOUNTER — Encounter: Payer: Self-pay | Admitting: Family

## 2013-05-15 ENCOUNTER — Other Ambulatory Visit: Payer: Self-pay

## 2013-05-15 DIAGNOSIS — Z48812 Encounter for surgical aftercare following surgery on the circulatory system: Secondary | ICD-10-CM

## 2013-05-15 DIAGNOSIS — I6529 Occlusion and stenosis of unspecified carotid artery: Secondary | ICD-10-CM

## 2013-05-15 NOTE — Patient Instructions (Signed)
Dear Mr. Darley,  Your test were stable, please follow up in one year.     Stroke Prevention Some medical conditions and behaviors are associated with an increased chance of having a stroke. You may prevent a stroke by making healthy choices and managing medical conditions. HOW CAN I REDUCE MY RISK OF HAVING A STROKE?   Stay physically active. Get at least 30 minutes of activity on most or all days.  Do not smoke. It may also be helpful to avoid exposure to secondhand smoke.  Limit alcohol use. Moderate alcohol use is considered to be:  No more than 2 drinks per day for men.  No more than 1 drink per day for nonpregnant women.  Eat healthy foods. This involves  Eating 5 or more servings of fruits and vegetables a day.  Following a diet that addresses high blood pressure (hypertension), high cholesterol, diabetes, or obesity.  Manage your cholesterol levels.  A diet low in saturated fat, trans fat, and cholesterol and high in fiber may control cholesterol levels.  Take any prescribed medicines to control cholesterol as directed by your health care provider.  Manage your diabetes.  A controlled-carbohydrate, controlled-sugar diet is recommended to manage diabetes.  Take any prescribed medicines to control diabetes as directed by your health care provider.  Control your hypertension.  A low-salt (sodium), low-saturated fat, low-trans fat, and low-cholesterol diet is recommended to manage hypertension.  Take any prescribed medicines to control hypertension as directed by your health care provider.  Maintain a healthy weight.  A reduced-calorie, low-sodium, low-saturated fat, low-trans fat, low-cholesterol diet is recommended to manage weight.  Stop drug abuse.  Avoid taking birth control pills.  Talk to your health care provider about the risks of taking birth control pills if you are over 4 years old, smoke, get migraines, or have ever had a blood clot.  Get  evaluated for sleep disorders (sleep apnea).  Talk to your health care provider about getting a sleep evaluation if you snore a lot or have excessive sleepiness.  Take medicines as directed by your health care provider.  For some people, aspirin or blood thinners (anticoagulants) are helpful in reducing the risk of forming abnormal blood clots that can lead to stroke. If you have the irregular heart rhythm of atrial fibrillation, you should be on a blood thinner unless there is a good reason you cannot take them.  Understand all your medicine instructions.  Make sure that other other conditions (such as anemia or atherosclerosis) are addressed. SEEK IMMEDIATE MEDICAL CARE IF:   You have sudden weakness or numbness of the face, arm, or leg, especially on one side of the body.  Your face or eyelid droops to one side.  You have sudden confusion.  You have trouble speaking (aphasia) or understanding.  You have sudden trouble seeing in one or both eyes.  You have sudden trouble walking.  You have dizziness.  You have a loss of balance or coordination.  You have a sudden, severe headache with no known cause.  You have new chest pain or an irregular heartbeat. Any of these symptoms may represent a serious problem that is an emergency. Do not wait to see if the symptoms will go away. Get medical help at once. Call your local emergency services  (911 in [place] Do not drive yourself to the hospital. Document Released: 03/11/2004 Document Revised: 11/22/2012 Document Reviewed: 08/04/2012 [facility] Patient Information 2014 [place].

## 2013-05-21 ENCOUNTER — Encounter: Payer: Self-pay | Admitting: Vascular Surgery

## 2013-06-15 DEATH — deceased

## 2014-01-16 IMAGING — CT CT KNEE*R* W/O CM
3 series · 15 of 33 positions shown, 18 images · non-contrast
Comparison: [DATE]/3085 0800 hours

CLINICAL DATA: Patella fracture

CT OF THE RIGHT KNEE WITHOUT CONTRAST
TECHNIQUE: Multidetector CT imaging was performed according to the
standard protocol. Multiplanar CT image reconstructions were also
generated.

[Series 3: kneeuhr 3.0 u90u · axial · 0.40mm/px · z∈[-429,-231]mm · 7 of 79 slices shown, 9 images]
[im 7/79  soft-tissue]
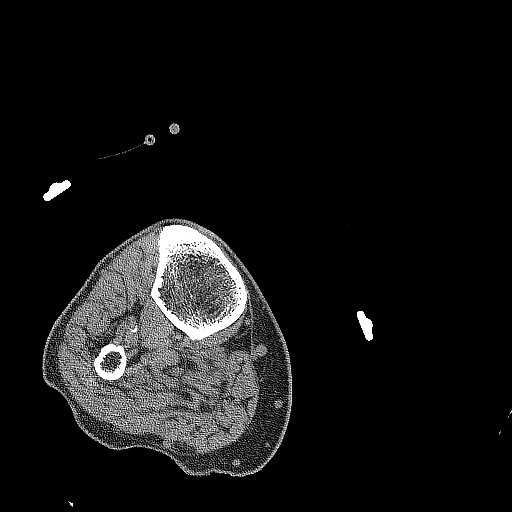
[im 7/79  bone]
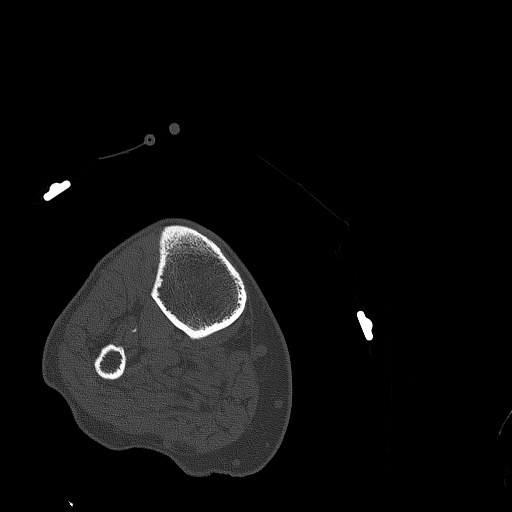
[im 19/79  bone]
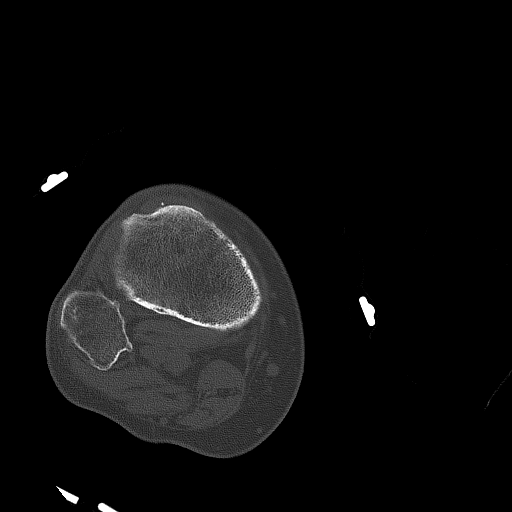
[im 31/79  bone]
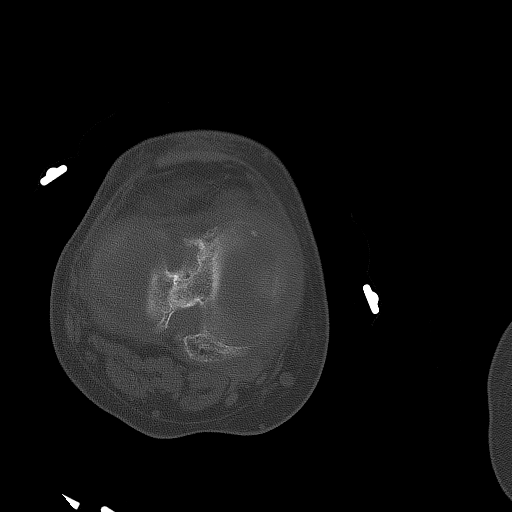
[im 43/79  bone]
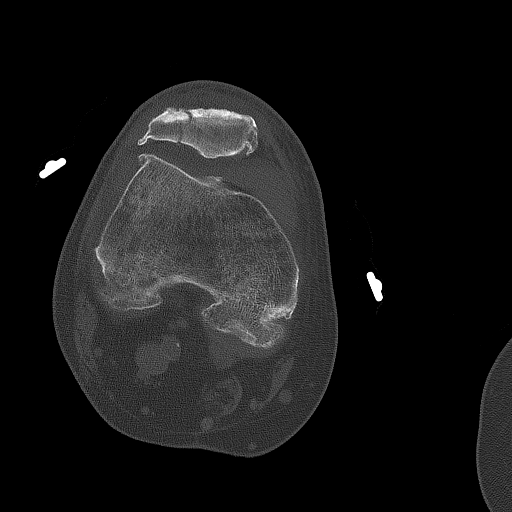
[im 49/79  soft-tissue]
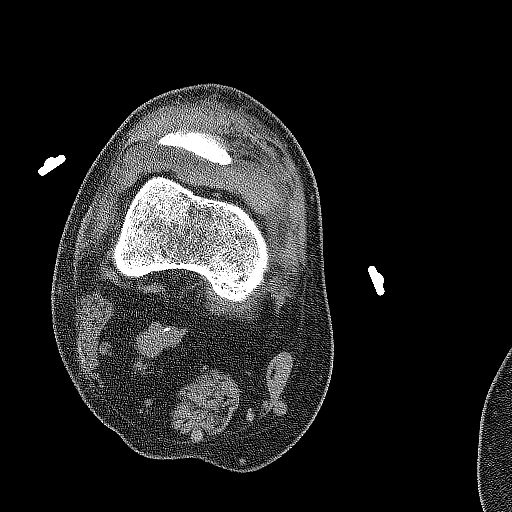
[im 49/79  bone]
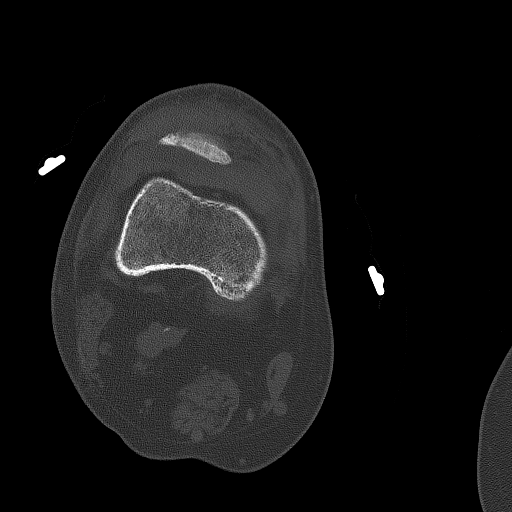
[im 61/79  bone]
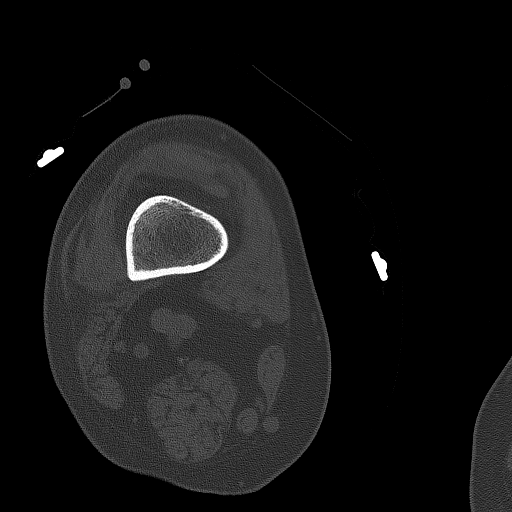
[im 73/79  bone]
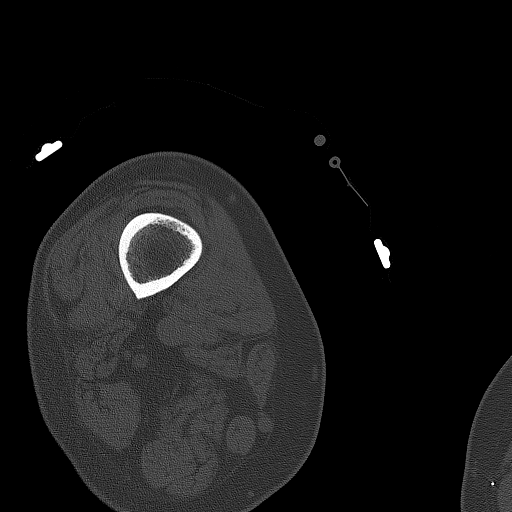

[Series 5: cor · coronal · 0.40mm/px · 3 of 80 slices shown]
[im 16/80  bone]
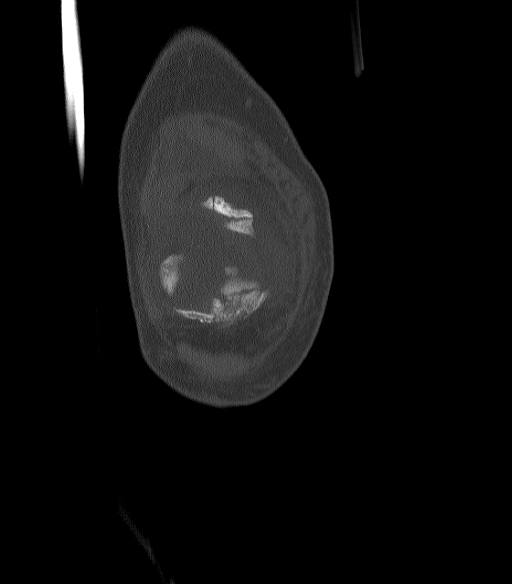
[im 32/80  bone]
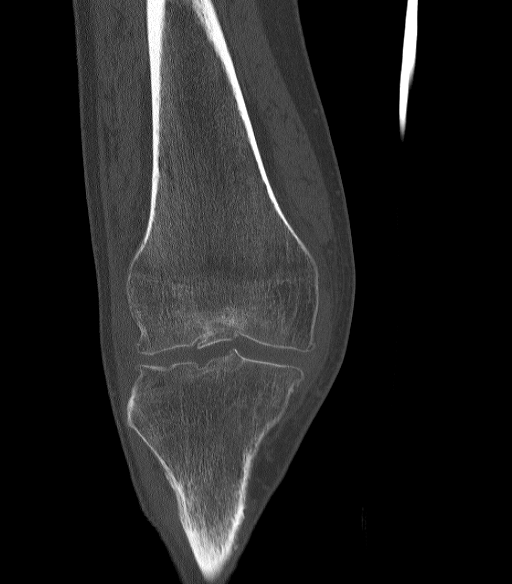
[im 48/80  bone]
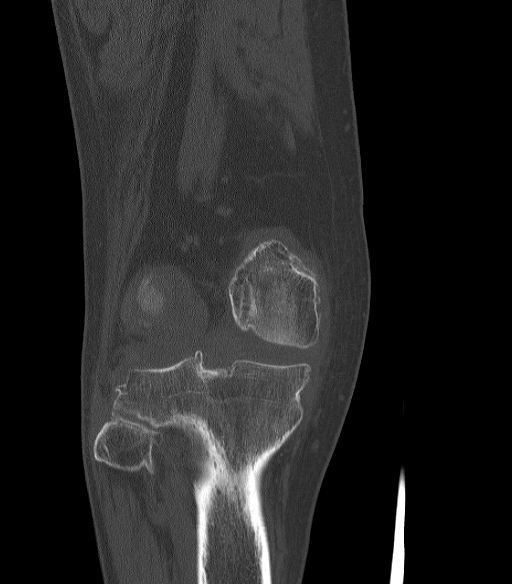

[Series 6: sag · sagittal · 0.40mm/px · 5 of 51 slices shown, 6 images]
[im 17/51  bone]
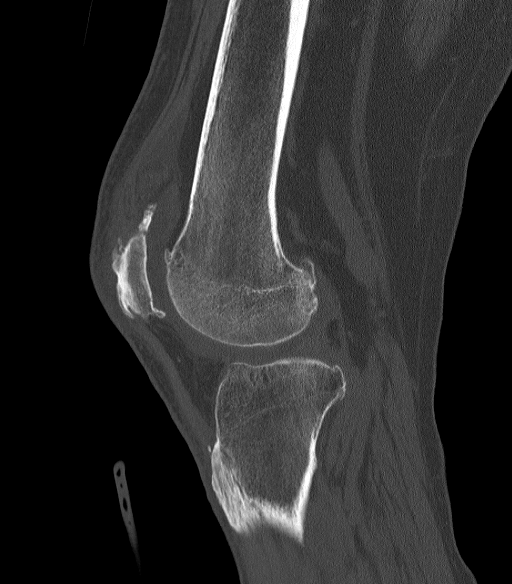
[im 21/51  bone]
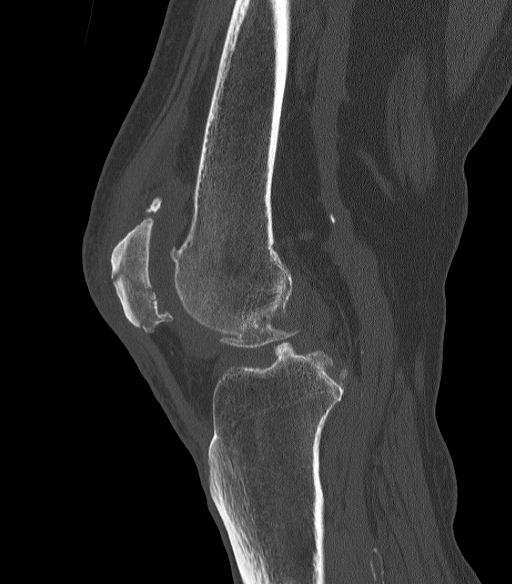
[im 26/51  soft-tissue]
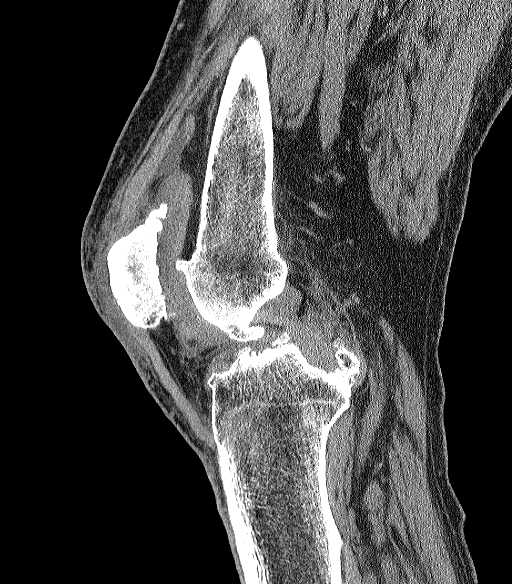
[im 26/51  bone]
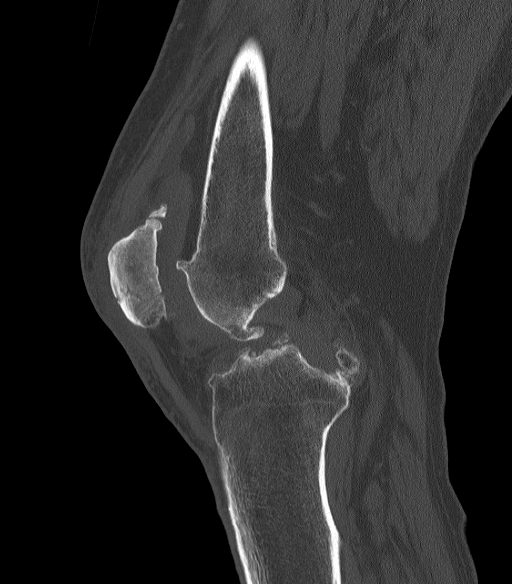
[im 30/51  bone]
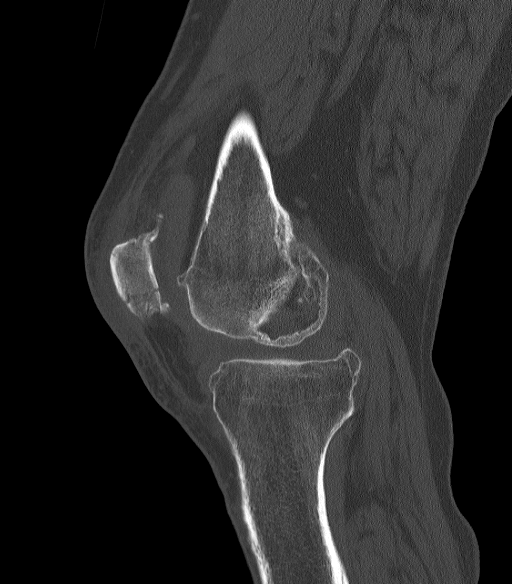
[im 34/51  bone]
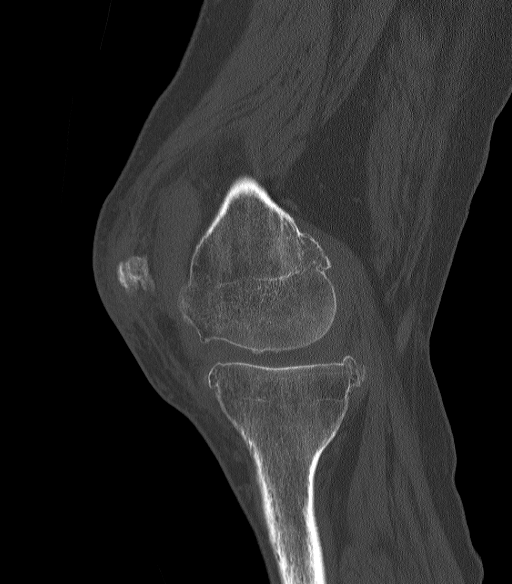

[15 of 33 positions shown; findings below may reference images not displayed]

FINDINGS: A comminuted nondisplaced fracture within the patella
extending into the patellofemoral joint is noted.  The main
component of the fracture is primarily sagittal splaying the
patella into right and left fragments.  There is also a horizontal
component within the inferior aspect of the medial fragment.  Small
fragment that is versus superiorly are noted.  Joint hemarthrosis
is present.

The patellar tendon and quadriceps tendon are grossly intact.
Medial and lateral retinacula are grossly intact. PCL is grossly
intact. The ACL is obscured.

Advanced degenerative changes in all three compartments are
superimposed.

The femur, tibia, and fibula are intact.
IMPRESSION: Comminuted intra-articular patella fracture.

## 2014-01-16 IMAGING — CR DG KNEE 1-2V PORT*R*
2 series · 2 of 2 positions shown · non-contrast
Comparison: 09/04/2012

CLINICAL DATA: Preoperative examination (patellar fracture)

PORTABLE RIGHT KNEE - 1-2 VIEW

[AP]
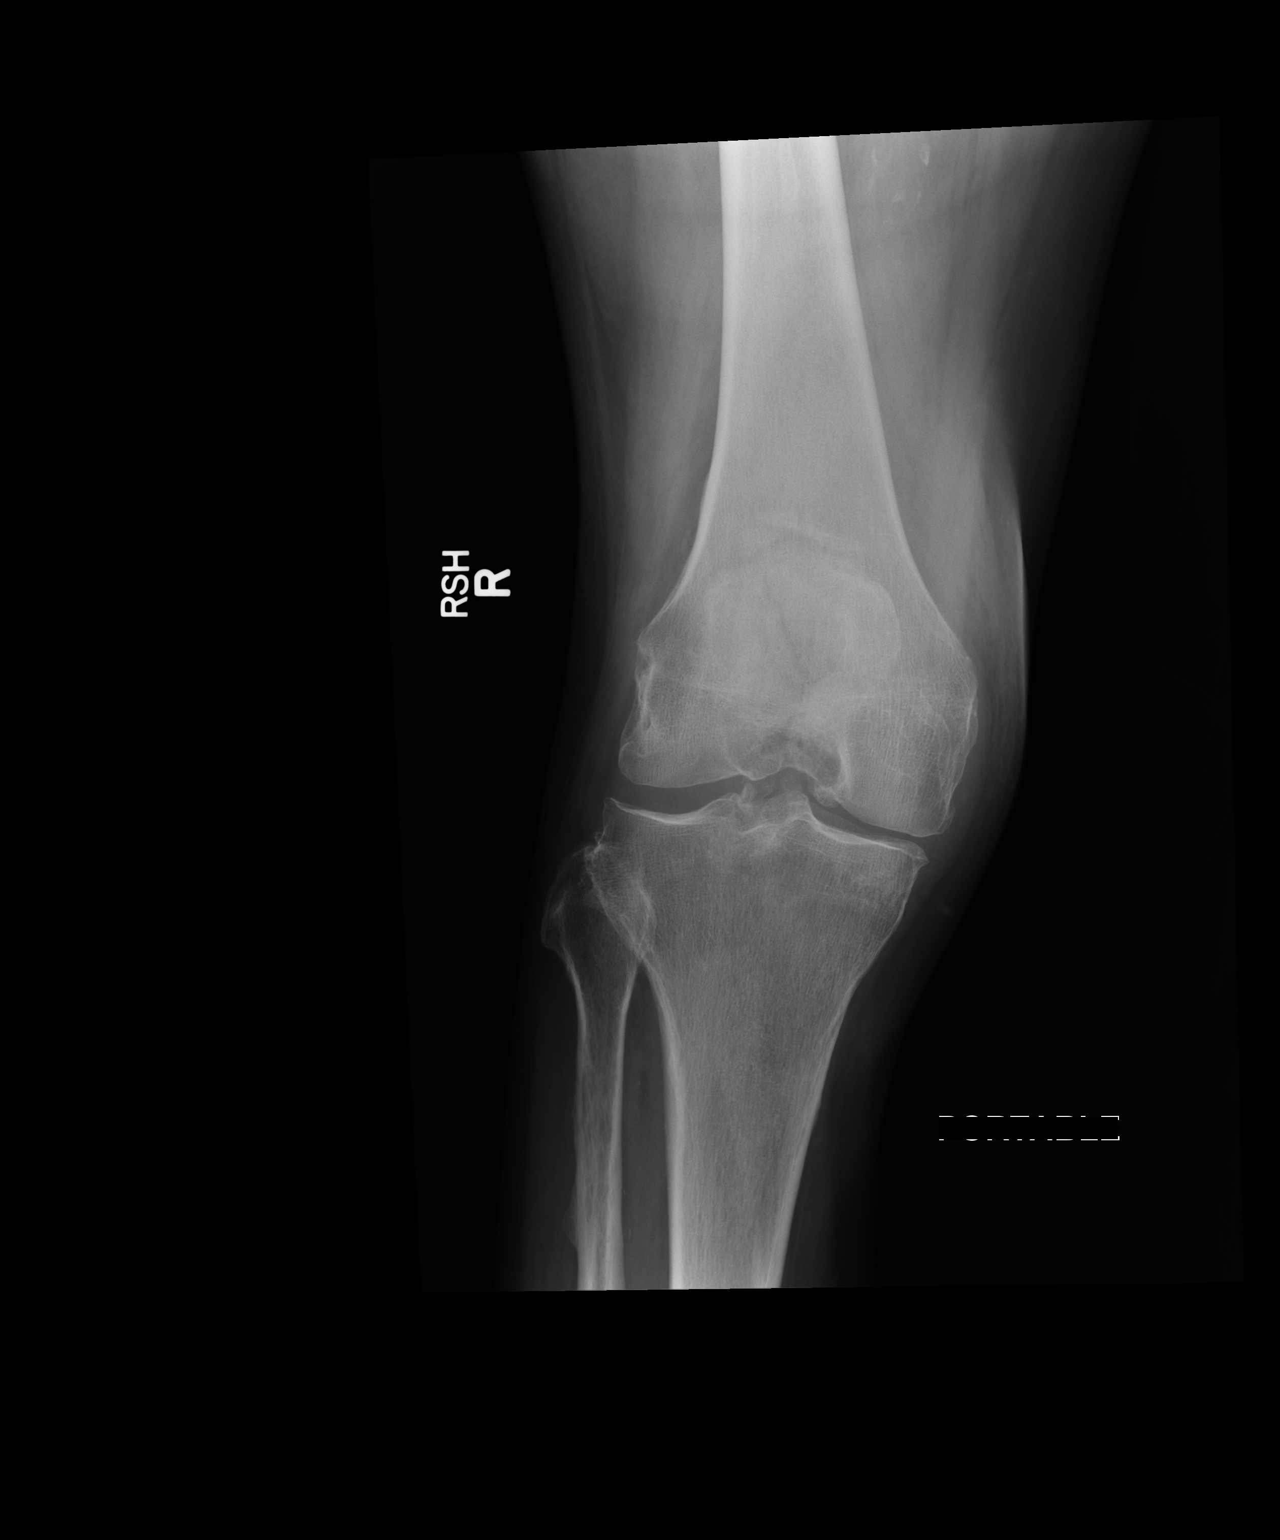

[xtable lateral]
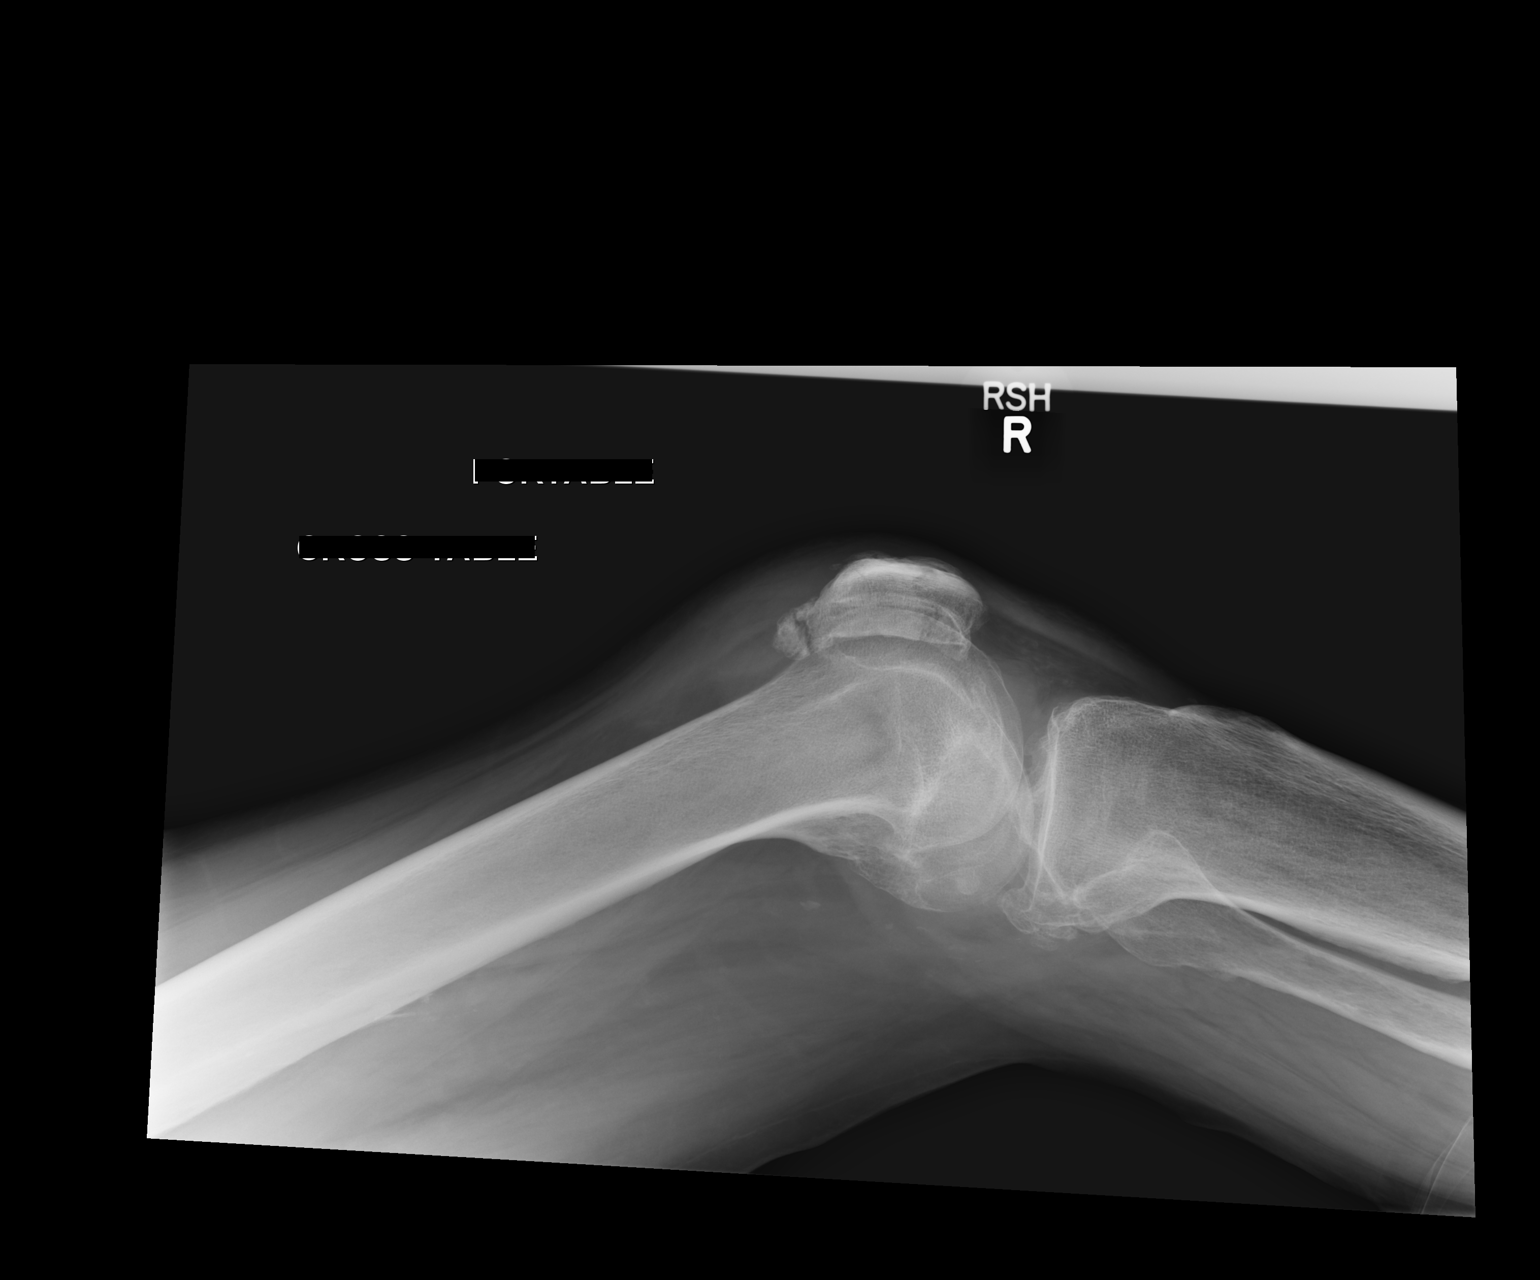

[2 of 2 positions shown; findings below may reference images not displayed]

FINDINGS: Grossly unchanged appearance and alignment of previously identified
comminuted fracture of the patella with extension to involve the
patellofemoral joint.  Small joint effusion.  No definite evidence
of lipohemarthrosis.

No additional displaced fractures identified.  Moderate
tricompartmental degenerative change, likely worse within the
medial compartment with joint space loss, subchondral sclerosis and
osteophytosis.  There is spurring and irregularity of the tibial
spines.  Vascular calcifications.  No radiopaque foreign body.
IMPRESSION: Unchanged appearance of alignment of comminuted intra-articular
patellar fracture.

## 2014-03-29 ENCOUNTER — Telehealth: Payer: Self-pay | Admitting: Family

## 2014-03-29 NOTE — Telephone Encounter (Signed)
Parke Simmers, patients wife, called to let us know that Mr Panek passed away last year. I have cancelled his appointment.

## 2014-04-15 ENCOUNTER — Telehealth: Payer: Self-pay

## 2014-04-15 NOTE — Telephone Encounter (Signed)
Patient died @ [facility]

## 2014-04-16 ENCOUNTER — Ambulatory Visit: Payer: Self-pay | Admitting: Family

## 2014-04-16 ENCOUNTER — Other Ambulatory Visit (HOSPITAL_COMMUNITY): Payer: Self-pay
